# Patient Record
Sex: Male | Born: 1940 | Race: White | Hispanic: No | Marital: Married | Smoking: Former smoker
Health system: Southern US, Community
[De-identification: ages and names within clinical notes are randomized; demographics above are authoritative.]

## PROBLEM LIST (undated history)

## (undated) DIAGNOSIS — I071 Rheumatic tricuspid insufficiency: Secondary | ICD-10-CM

## (undated) DIAGNOSIS — I340 Nonrheumatic mitral (valve) insufficiency: Secondary | ICD-10-CM

## (undated) DIAGNOSIS — D649 Anemia, unspecified: Secondary | ICD-10-CM

## (undated) DIAGNOSIS — I2510 Atherosclerotic heart disease of native coronary artery without angina pectoris: Secondary | ICD-10-CM

## (undated) DIAGNOSIS — I119 Hypertensive heart disease without heart failure: Secondary | ICD-10-CM

## (undated) DIAGNOSIS — E785 Hyperlipidemia, unspecified: Secondary | ICD-10-CM

## (undated) DIAGNOSIS — R42 Dizziness and giddiness: Secondary | ICD-10-CM

## (undated) DIAGNOSIS — I499 Cardiac arrhythmia, unspecified: Secondary | ICD-10-CM

## (undated) DIAGNOSIS — I10 Essential (primary) hypertension: Secondary | ICD-10-CM

## (undated) DIAGNOSIS — Z974 Presence of external hearing-aid: Secondary | ICD-10-CM

## (undated) DIAGNOSIS — I272 Pulmonary hypertension, unspecified: Secondary | ICD-10-CM

## (undated) DIAGNOSIS — E119 Type 2 diabetes mellitus without complications: Secondary | ICD-10-CM

## (undated) DIAGNOSIS — Z972 Presence of dental prosthetic device (complete) (partial): Secondary | ICD-10-CM

## (undated) DIAGNOSIS — R519 Headache, unspecified: Secondary | ICD-10-CM

## (undated) DIAGNOSIS — R51 Headache: Secondary | ICD-10-CM

## (undated) HISTORY — PX: CHOLECYSTECTOMY: SHX55

## (undated) HISTORY — PX: CARDIAC CATHETERIZATION: SHX172

## (undated) HISTORY — PX: EYE SURGERY: SHX253

---

## 2004-07-25 ENCOUNTER — Ambulatory Visit: Payer: Self-pay | Admitting: Otolaryngology

## 2004-08-01 ENCOUNTER — Ambulatory Visit: Payer: Self-pay | Admitting: Otolaryngology

## 2004-10-07 ENCOUNTER — Ambulatory Visit: Payer: Self-pay | Admitting: Family Medicine

## 2006-03-25 ENCOUNTER — Ambulatory Visit: Payer: Self-pay | Admitting: Unknown Physician Specialty

## 2006-12-09 IMAGING — CT CT HEAD WITHOUT CONTRAST
1 series · 16 of 30 positions shown, 20 images · non-contrast
Comparison: none

REASON FOR EXAM: LEFT side hearing loss
COMMENTS:

[Series 2: without · axial · non-contrast · 0.41mm/px · z∈[+326,+481]mm · 16 of 35 slices shown, 20 images]
[im 2/35  brain]
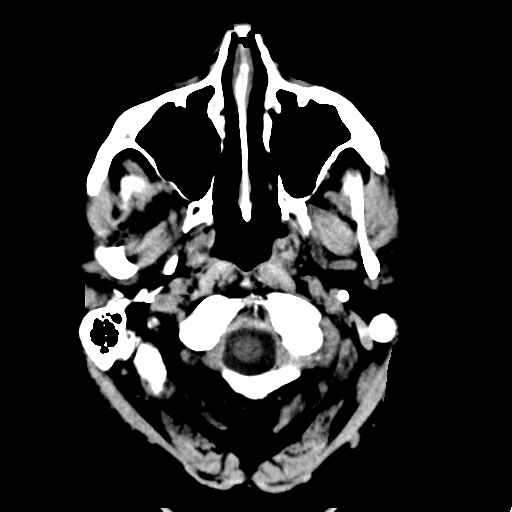
[im 2/35  bone]
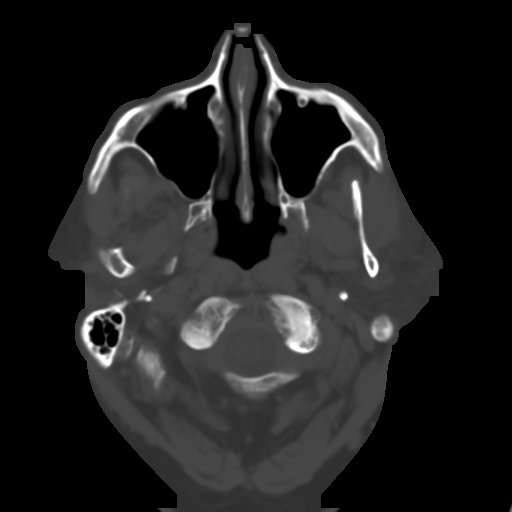
[im 4/35  brain]
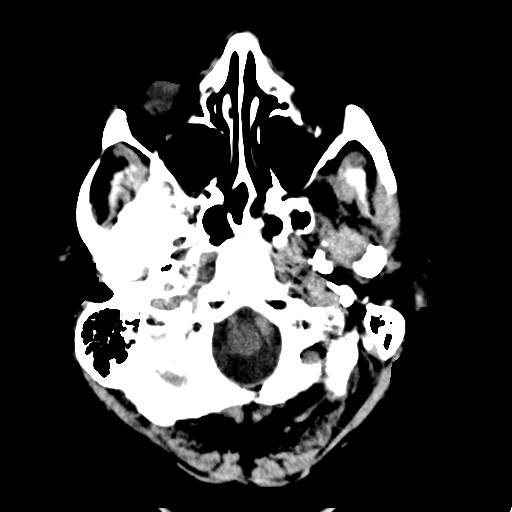
[im 6/35  brain]
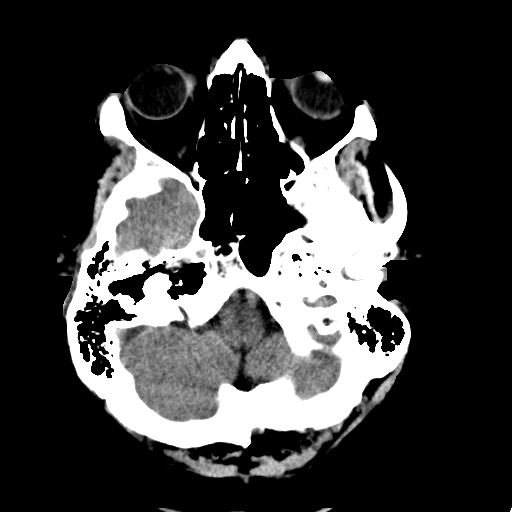
[im 9/35  brain]
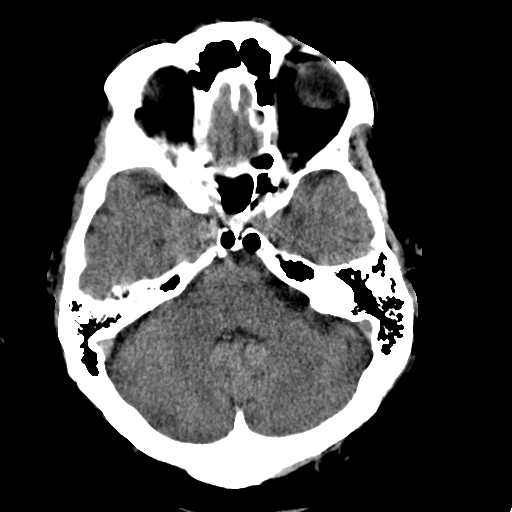
[im 10/35  brain]
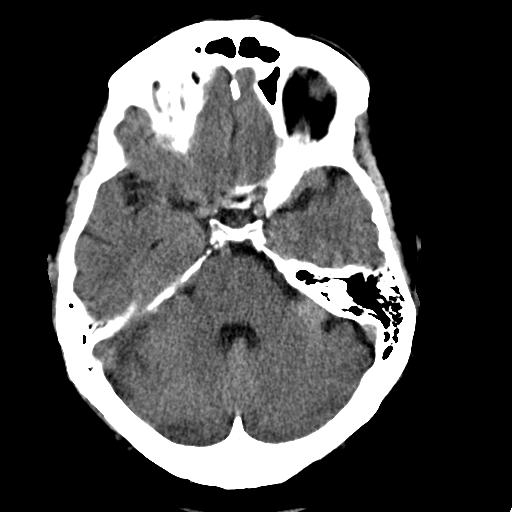
[im 10/35  bone]
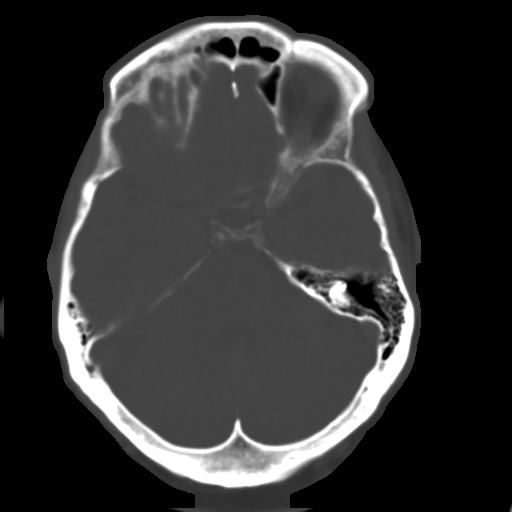
[im 12/35  brain]
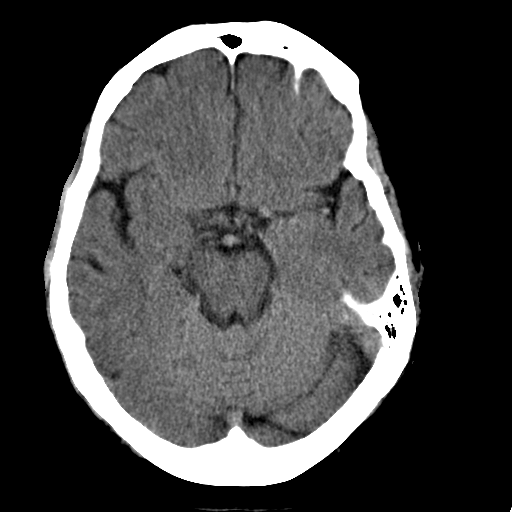
[im 15/35  brain]
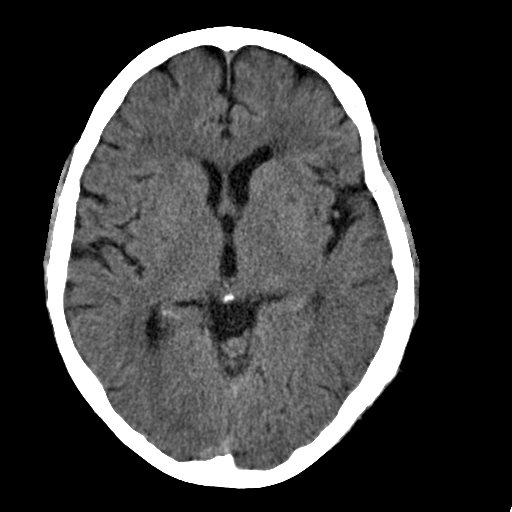
[im 17/35  brain]
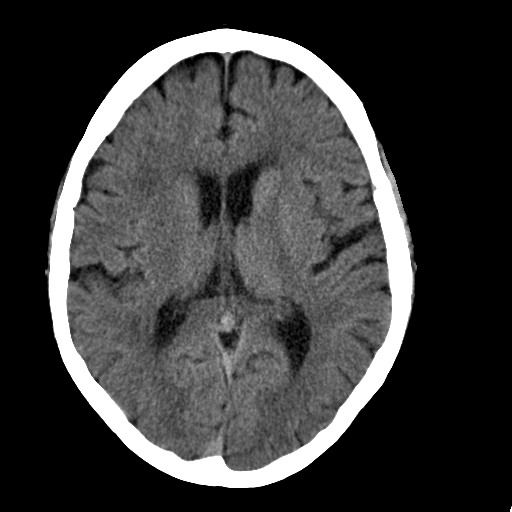
[im 18/35  brain]
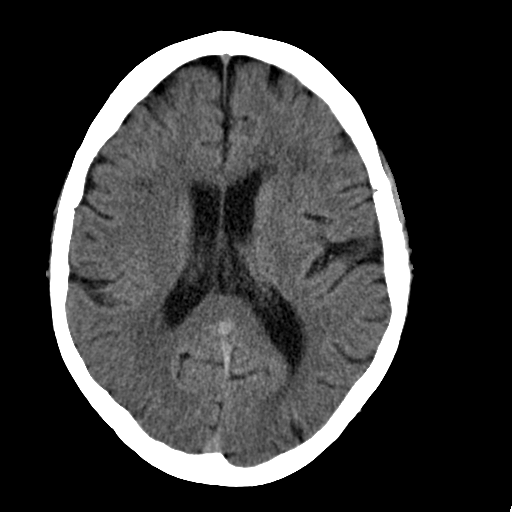
[im 18/35  bone]
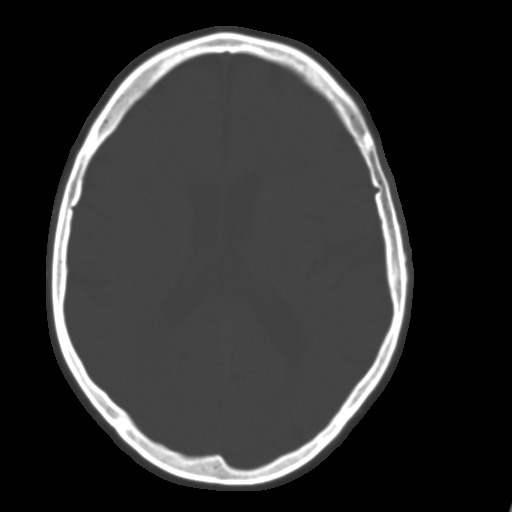
[im 20/35  brain]
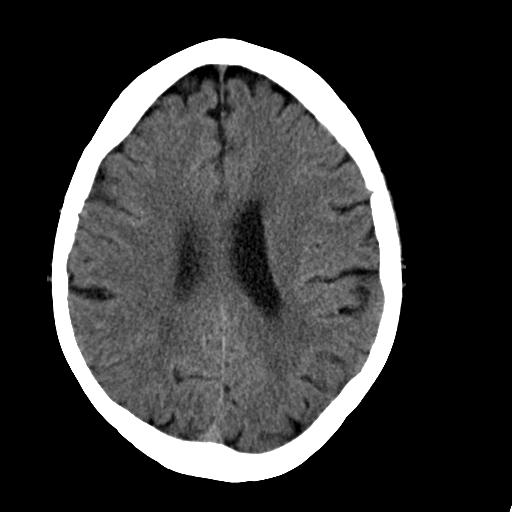
[im 23/35  brain]
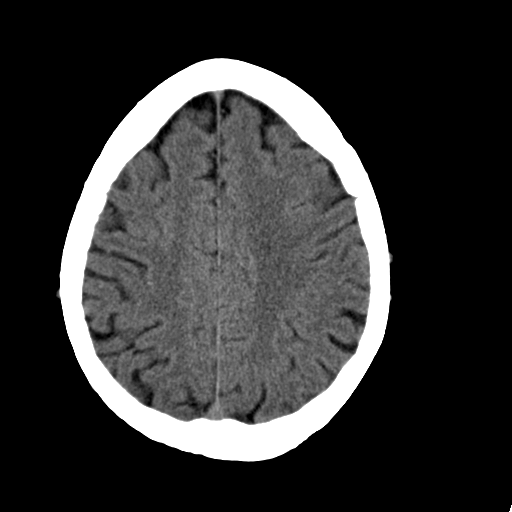
[im 25/35  brain]
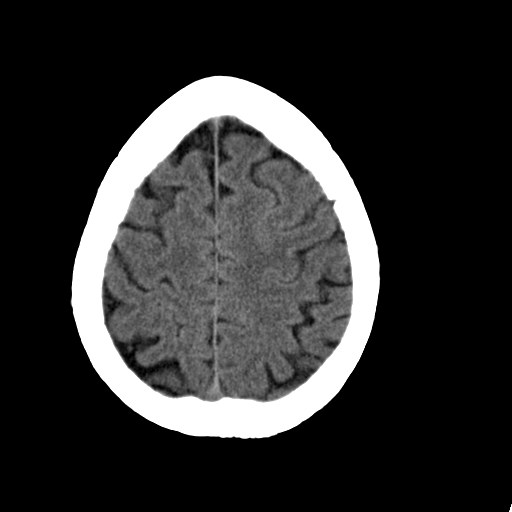
[im 26/35  brain]
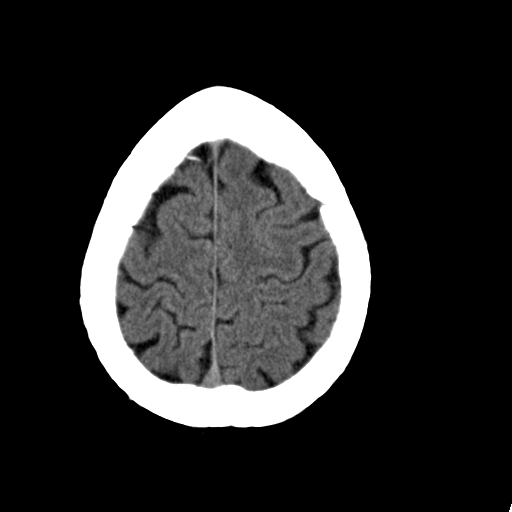
[im 26/35  bone]
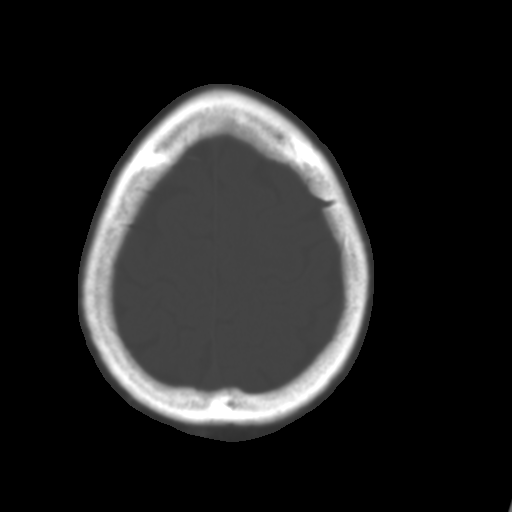
[im 29/35  brain]
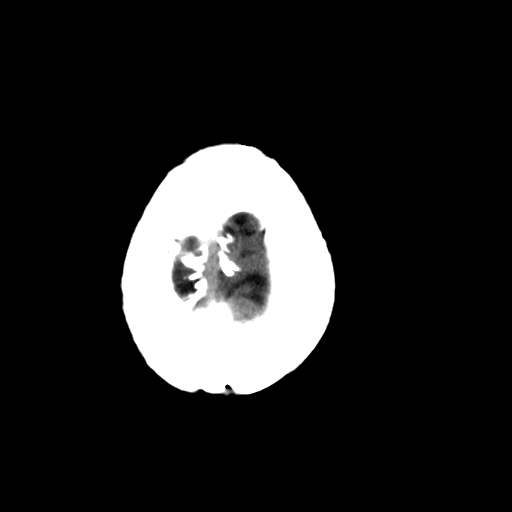
[im 31/35  brain]
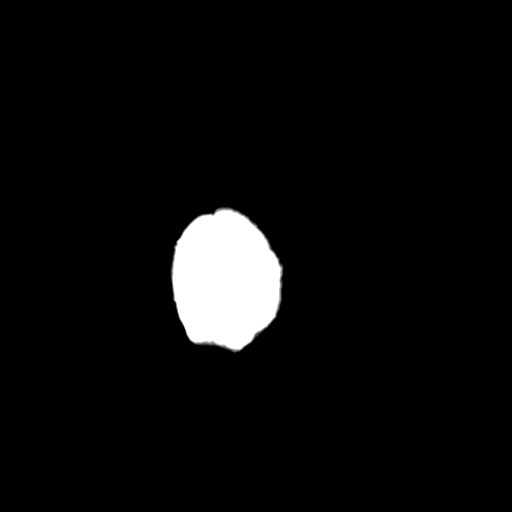
[im 33/35  brain]
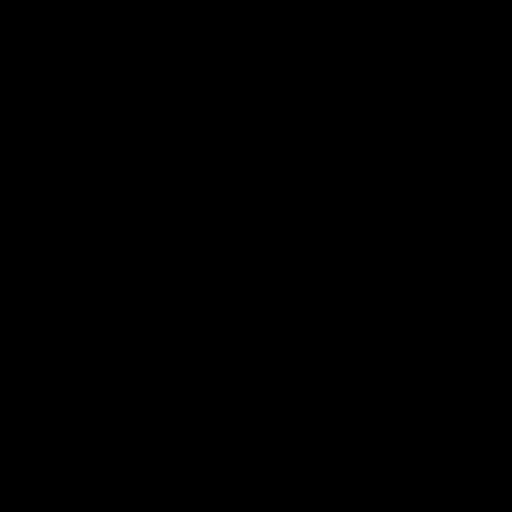

[16 of 30 positions shown; findings below may reference images not displayed]

PROCEDURE:     CT  - CT HEAD WITHOUT CONTRAST  - August 01, 2004  [DATE]

RESULT:     There is no evidence of intraaxial or extraaxial fluid
collections or evidence of acute hemorrhage.

Age-related changes are evident demonstrated by small vessel periventricular
white matter ischemia and mild cortical atrophy.
IMPRESSION: Age-related and chronic changes as described above.

Note not mentioned above there is also evidence of mild basal ganglia
calcifications.

No evidence of acute abnormalities.

## 2007-09-28 ENCOUNTER — Ambulatory Visit: Payer: Self-pay | Admitting: Ophthalmology

## 2008-04-24 ENCOUNTER — Ambulatory Visit: Payer: Self-pay | Admitting: Internal Medicine

## 2008-06-06 ENCOUNTER — Ambulatory Visit: Payer: Self-pay | Admitting: Unknown Physician Specialty

## 2010-09-01 IMAGING — US US EXTREM LOW VENOUS*R*
1 series · 17 of 24 positions shown · non-contrast
Comparison: none

eval DVT
COMMENTS:

PROCEDURE:     US  - US DOPPLER LOW EXTR RIGHT  - April 24, 2008  [DATE]
RESULT:     The augmentation flow waveforms are normal in appearance. The
femoral and popliteal vein shows complete compressibility throughout its
course. Doppler examination shows no occlusion or evidence of deep vein
thrombosis. In the soft tissues of the lower leg near the region of trauma
there are noted two superficial fluid collections with the larger measuring
2.97 cm x 0.98 cm x 2.15 cm and the smaller which is located more superiorly
measuring 2.16 cm x 1.13 cm x 2.72 cm. These sites likely represent areas of
hematoma although serous or lymphatic collections could produce a similar
appearance.

[Series 1: us extrem low venous*right* · 17 of 34 slices shown]
[im 1/34]
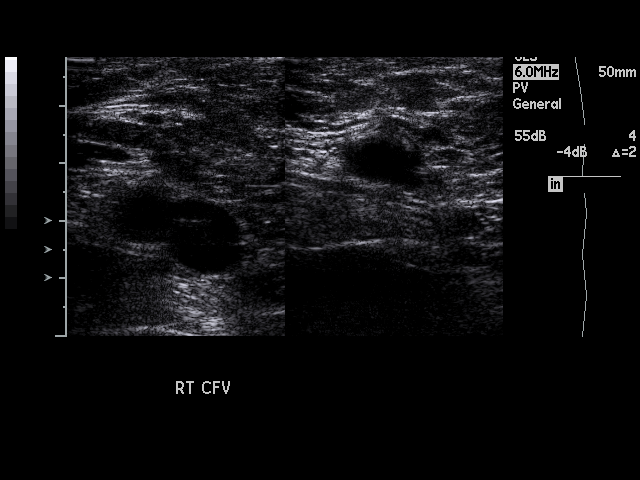
[im 3/34]
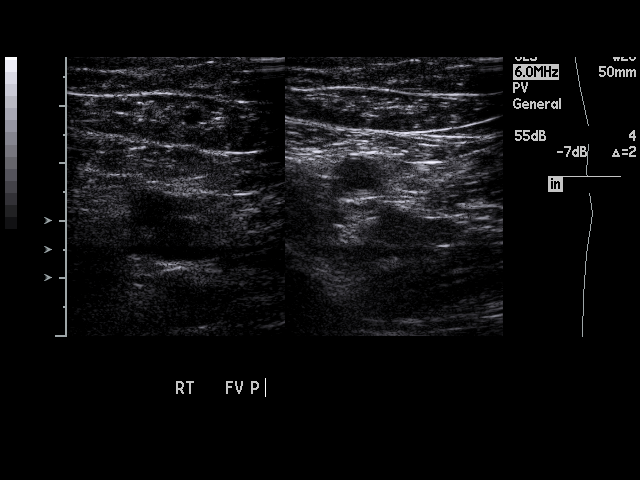
[im 5/34]
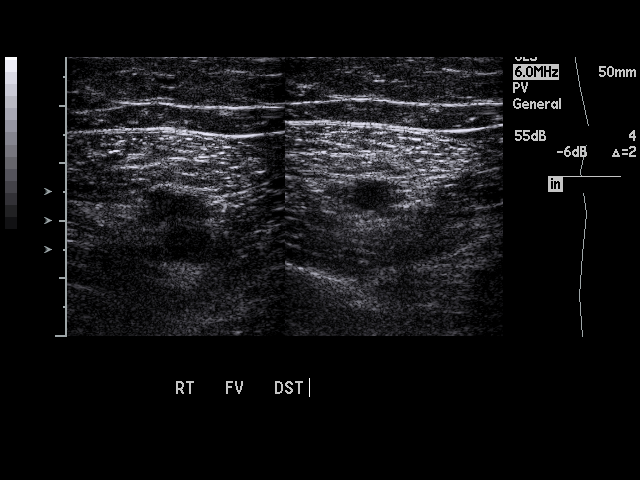
[im 6/34]
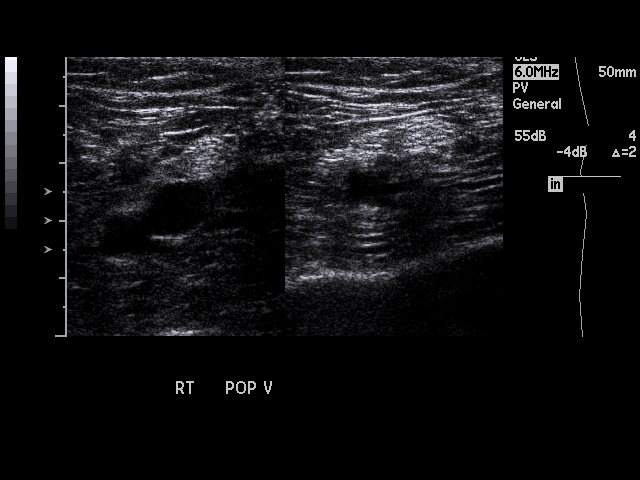
[im 9/34]
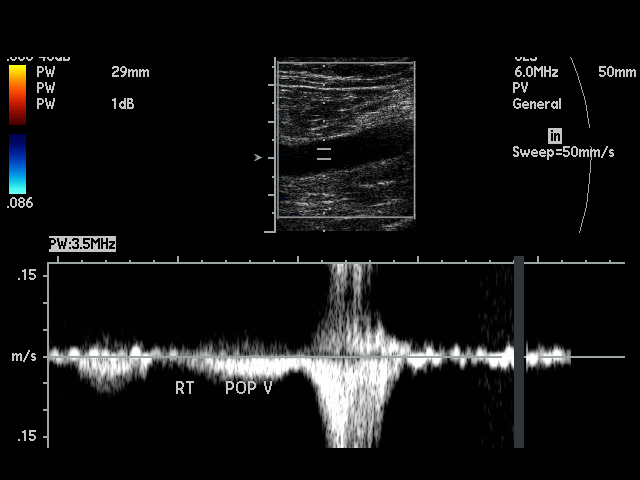
[im 11/34]
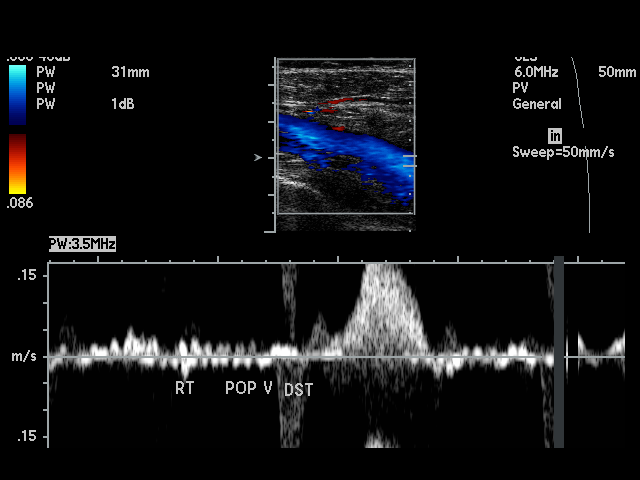
[im 13/34]
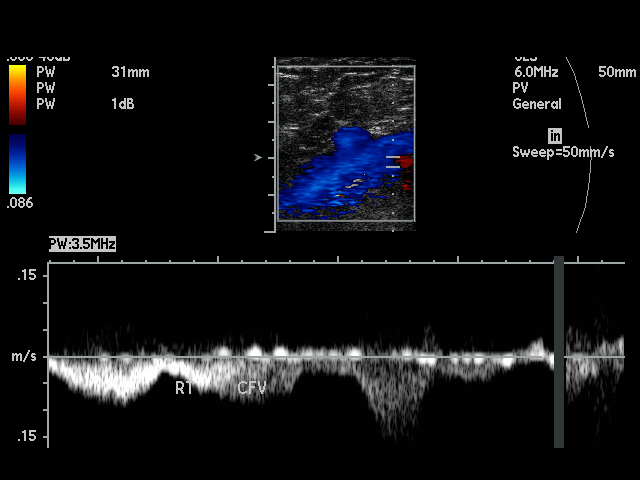
[im 15/34]
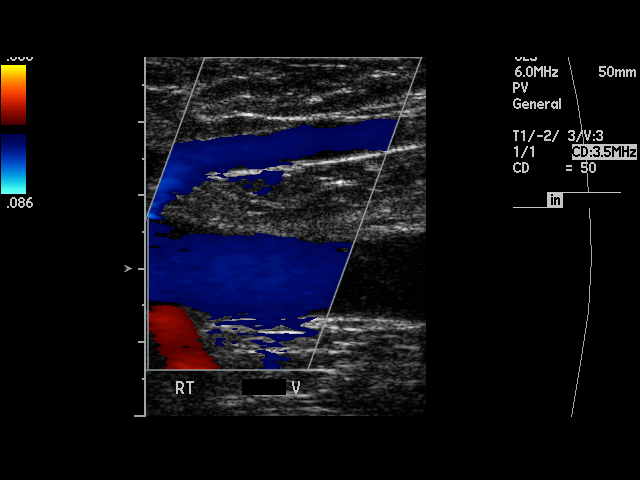
[im 18/34]
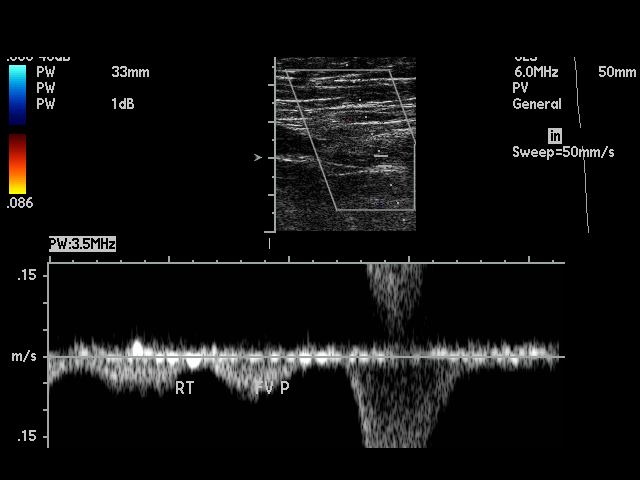
[im 19/34]
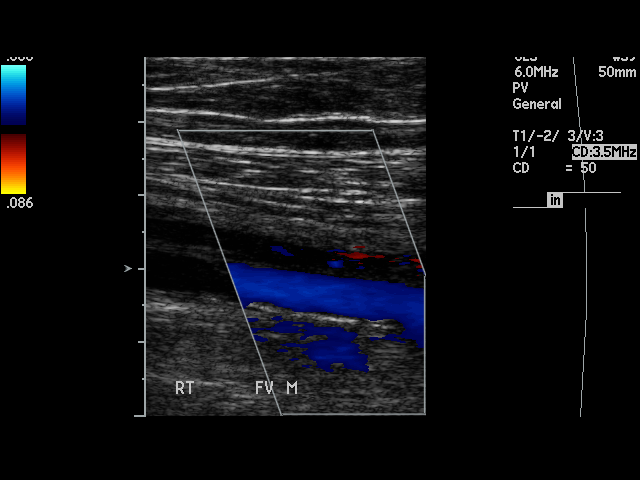
[im 21/34]
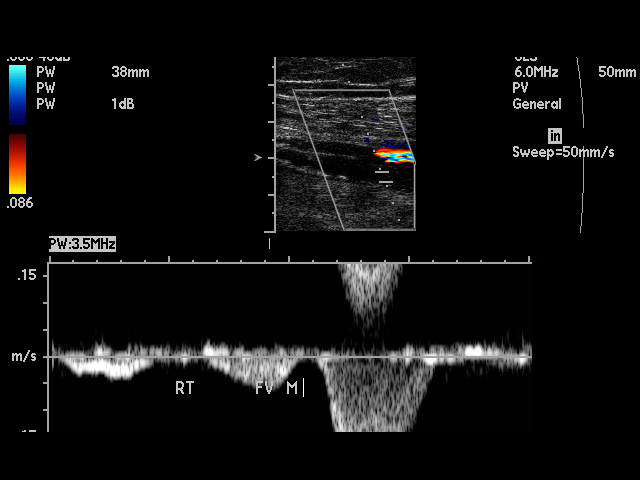
[im 23/34]
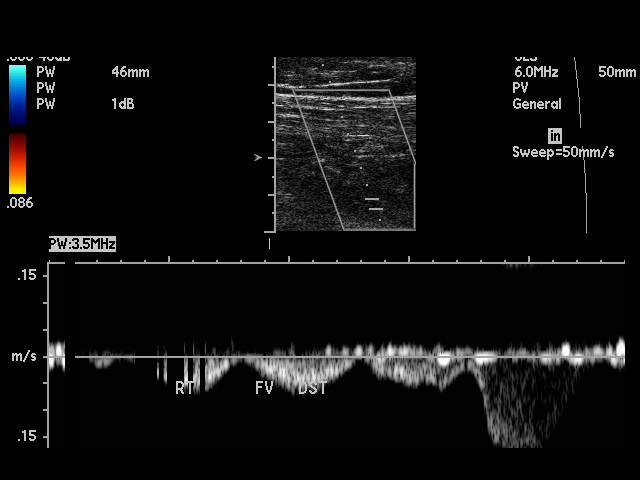
[im 25/34]
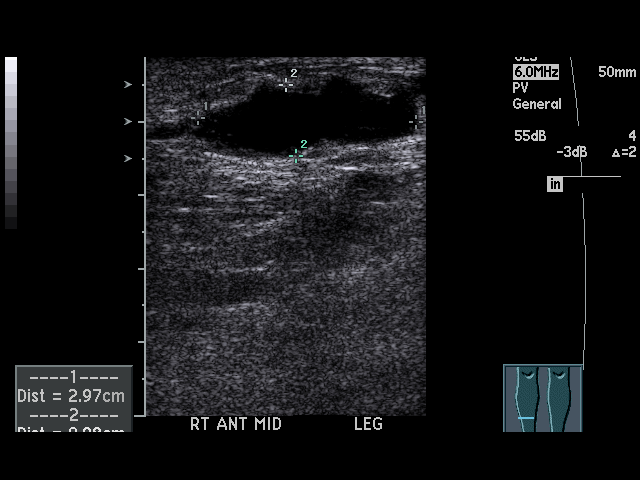
[im 28/34]
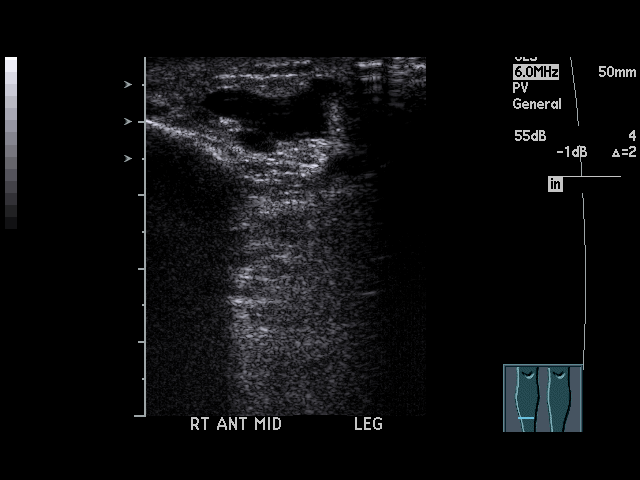
[im 29/34]
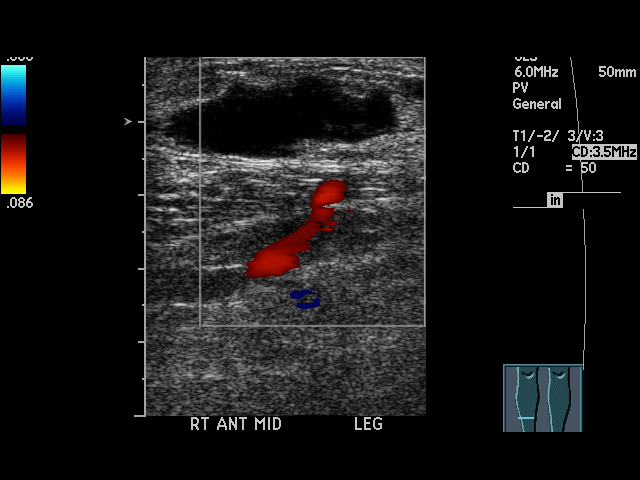
[im 31/34]
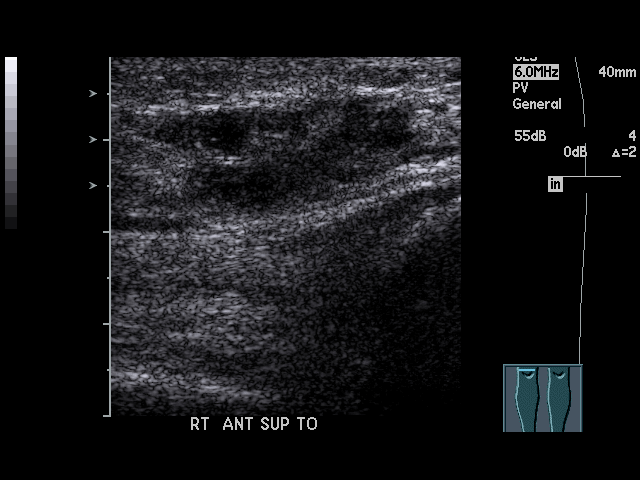
[im 34/34]
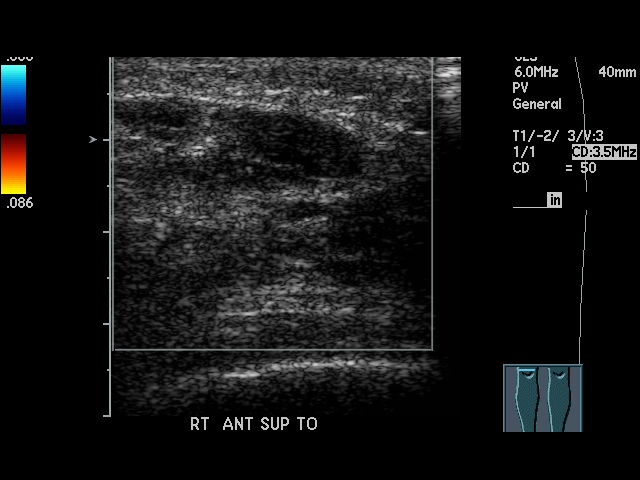

[17 of 24 positions shown; findings below may reference images not displayed]

IMPRESSION: 1. No deep vein thrombosis is identified.
2. Fluid collections are noted in the lower leg and likely represent
hematomas in view of patient's history of recent trauma.

## 2011-12-30 ENCOUNTER — Encounter: Payer: Self-pay | Admitting: Unknown Physician Specialty

## 2012-01-21 ENCOUNTER — Encounter: Payer: Self-pay | Admitting: Unknown Physician Specialty

## 2014-11-10 ENCOUNTER — Encounter: Payer: Self-pay | Admitting: *Deleted

## 2014-11-13 ENCOUNTER — Ambulatory Visit: Payer: Medicare Other | Admitting: Anesthesiology

## 2014-11-13 ENCOUNTER — Encounter
Admission: RE | Disposition: A | Discharge status: Home / Self Care | Payer: Self-pay | Source: Ambulatory Visit | Attending: Unknown Physician Specialty

## 2014-11-13 ENCOUNTER — Ambulatory Visit
Admission: RE | Admit: 2014-11-13 | Discharge: 2014-11-13 | Disposition: A | Discharge status: Home / Self Care | Payer: Medicare Other | Source: Ambulatory Visit | Attending: Unknown Physician Specialty | Admitting: Unknown Physician Specialty

## 2014-11-13 ENCOUNTER — Encounter: Payer: Self-pay | Admitting: *Deleted

## 2014-11-13 DIAGNOSIS — I4891 Unspecified atrial fibrillation: Secondary | ICD-10-CM | POA: Insufficient documentation

## 2014-11-13 DIAGNOSIS — I340 Nonrheumatic mitral (valve) insufficiency: Secondary | ICD-10-CM | POA: Insufficient documentation

## 2014-11-13 DIAGNOSIS — Z86010 Personal history of colonic polyps: Secondary | ICD-10-CM | POA: Diagnosis not present

## 2014-11-13 DIAGNOSIS — K5730 Diverticulosis of large intestine without perforation or abscess without bleeding: Secondary | ICD-10-CM | POA: Diagnosis not present

## 2014-11-13 DIAGNOSIS — K640 First degree hemorrhoids: Secondary | ICD-10-CM | POA: Diagnosis not present

## 2014-11-13 DIAGNOSIS — I119 Hypertensive heart disease without heart failure: Secondary | ICD-10-CM | POA: Insufficient documentation

## 2014-11-13 DIAGNOSIS — Z79899 Other long term (current) drug therapy: Secondary | ICD-10-CM | POA: Insufficient documentation

## 2014-11-13 DIAGNOSIS — Z87891 Personal history of nicotine dependence: Secondary | ICD-10-CM | POA: Diagnosis not present

## 2014-11-13 DIAGNOSIS — E119 Type 2 diabetes mellitus without complications: Secondary | ICD-10-CM | POA: Insufficient documentation

## 2014-11-13 DIAGNOSIS — I2510 Atherosclerotic heart disease of native coronary artery without angina pectoris: Secondary | ICD-10-CM | POA: Insufficient documentation

## 2014-11-13 DIAGNOSIS — Z7982 Long term (current) use of aspirin: Secondary | ICD-10-CM | POA: Diagnosis not present

## 2014-11-13 DIAGNOSIS — Z1211 Encounter for screening for malignant neoplasm of colon: Secondary | ICD-10-CM | POA: Insufficient documentation

## 2014-11-13 DIAGNOSIS — I361 Nonrheumatic tricuspid (valve) insufficiency: Secondary | ICD-10-CM | POA: Insufficient documentation

## 2014-11-13 DIAGNOSIS — I272 Other secondary pulmonary hypertension: Secondary | ICD-10-CM | POA: Insufficient documentation

## 2014-11-13 DIAGNOSIS — J449 Chronic obstructive pulmonary disease, unspecified: Secondary | ICD-10-CM | POA: Diagnosis not present

## 2014-11-13 DIAGNOSIS — E785 Hyperlipidemia, unspecified: Secondary | ICD-10-CM | POA: Insufficient documentation

## 2014-11-13 HISTORY — DX: Hypertensive heart disease without heart failure: I11.9

## 2014-11-13 HISTORY — DX: Headache: R51

## 2014-11-13 HISTORY — DX: Rheumatic tricuspid insufficiency: I07.1

## 2014-11-13 HISTORY — DX: Pulmonary hypertension, unspecified: I27.20

## 2014-11-13 HISTORY — DX: Essential (primary) hypertension: I10

## 2014-11-13 HISTORY — PX: COLONOSCOPY WITH PROPOFOL: SHX5780

## 2014-11-13 HISTORY — DX: Headache, unspecified: R51.9

## 2014-11-13 HISTORY — DX: Cardiac arrhythmia, unspecified: I49.9

## 2014-11-13 HISTORY — DX: Nonrheumatic mitral (valve) insufficiency: I34.0

## 2014-11-13 HISTORY — DX: Atherosclerotic heart disease of native coronary artery without angina pectoris: I25.10

## 2014-11-13 HISTORY — DX: Hyperlipidemia, unspecified: E78.5

## 2014-11-13 HISTORY — DX: Type 2 diabetes mellitus without complications: E11.9

## 2014-11-13 LAB — GLUCOSE, CAPILLARY: GLUCOSE-CAPILLARY: 100 mg/dL — AB (ref 65–99)

## 2014-11-13 SURGERY — COLONOSCOPY WITH PROPOFOL
Anesthesia: General

## 2014-11-13 MED ORDER — PROPOFOL 500 MG/50ML IV EMUL
INTRAVENOUS | Status: DC | PRN
  Administered 2014-11-13: 100 ug/kg/min via INTRAVENOUS

## 2014-11-13 MED ORDER — SODIUM CHLORIDE 0.9 % IV SOLN: INTRAVENOUS | Status: DC

## 2014-11-13 MED ORDER — LACTATED RINGERS IV SOLN
INTRAVENOUS | Status: DC | PRN
  Administered 2014-11-13: 09:00:00 via INTRAVENOUS

## 2014-11-13 MED ORDER — SODIUM CHLORIDE 0.9 % IV SOLN
INTRAVENOUS | Status: DC
  Administered 2014-11-13: 09:00:00 via INTRAVENOUS

## 2014-11-13 NOTE — Op Note (Signed)
[facility] Gastroenterology Patient Name: Paul Santos Procedure Date: 11/13/2014 8:44 AM MRN: [id number] Account #: [id number] Date of Birth: 11-15-1940 Admit Type: Outpatient Age: 74 Room: [facility] ENDO ROOM 1 Gender: Male Note Status: Finalized Procedure:         Colonoscopy Indications:       High risk colon cancer surveillance: Personal history of                     colonic polyps Providers:         Manya Silvas, MD Referring MD:      Irven Easterly. Kary Kos, MD (Referring MD) Medicines:         Propofol per Anesthesia Complications:     No immediate complications. Procedure:         Pre-Anesthesia Assessment:                    - After reviewing the risks and benefits, the patient was                     deemed in satisfactory condition to undergo the procedure.                    After obtaining informed consent, the colonoscope was                     passed under direct vision. Throughout the procedure, the                     patient's blood pressure, pulse, and oxygen saturations                     were monitored continuously. The Colonoscope was                     introduced through the anus and advanced to the the cecum,                     identified by appendiceal orifice and ileocecal valve. The                     colonoscopy was performed without difficulty. The patient                     tolerated the procedure well. The quality of the bowel                     preparation was good. Findings:      Many small-mouthed diverticula were found in the sigmoid colon and in       the descending colon.      Internal hemorrhoids were found during endoscopy. The hemorrhoids were       small, medium-sized and Grade I (internal hemorrhoids that do not       prolapse).      The exam was otherwise without abnormality. Impression:        - Diverticulosis in the sigmoid colon and in the                     descending colon.                    - Internal  hemorrhoids.                    - The  examination was otherwise normal.                    - No specimens collected. Recommendation:    - The findings and recommendations were discussed with the                     patient's family. Manya Silvas, MD 11/13/2014 9:02:56 AM This report has been signed electronically. Number of Addenda: 0 Note Initiated On: 11/13/2014 8:44 AM Scope Withdrawal Time: 0 hours 9 minutes 29 seconds  Total Procedure Duration: 0 hours 13 minutes 2 seconds       [facility]

## 2014-11-13 NOTE — Anesthesia Preprocedure Evaluation (Addendum)
Anesthesia Evaluation  Patient identified by MRN, date of birth, ID band Patient awake    Reviewed: Allergy & Precautions, H&P , NPO status , Patient's Chart, lab work & pertinent test results, reviewed documented beta blocker date and time   History of Anesthesia Complications Negative for: history of anesthetic complications  Airway Mallampati: III  TM Distance: >3 FB Neck ROM: full    Dental no notable dental hx. (+) Partial Upper, Partial Lower, Poor Dentition   Pulmonary neg shortness of breath, neg sleep apnea, neg COPD, neg recent URI, former smoker,    Pulmonary exam normal breath sounds clear to auscultation       Cardiovascular Exercise Tolerance: Good hypertension, (-) angina+ CAD and + Cardiac Stents (placed 10-12 years ago)  (-) Past MI and (-) CABG Normal cardiovascular exam+ dysrhythmias Atrial Fibrillation + Valvular Problems/Murmurs  Rhythm:irregular Rate:Abnormal + Systolic murmurs    Neuro/Psych negative neurological ROS  negative psych ROS   GI/Hepatic negative GI ROS, Neg liver ROS,   Endo/Other  diabetes, Well Controlled  Renal/GU negative Renal ROS  negative genitourinary   Musculoskeletal   Abdominal   Peds  Hematology negative hematology ROS (+)   Anesthesia Other Findings Past Medical History:   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*              Chronic pulmonary hypertension (HCC)                         Moderate tricuspid insufficiency                             Moderate mitral insufficiency                                Hypertension                                                 Diabetes mellitus without complication (HCC)                 Coronary artery disease                                      Dysrhythmia                                                    Comment:Atrial Fibrillation   Hyperlipidemia                                               Headache                                                        Comment:Migraines   Reproductive/Obstetrics negative OB  ROS                            Anesthesia Physical Anesthesia Plan  ASA: III  Anesthesia Plan: General   Post-op Pain Management:    Induction:   Airway Management Planned:   Additional Equipment:   Intra-op Plan:   Post-operative Plan:   Informed Consent: I have reviewed the patients History and Physical, chart, labs and discussed the procedure including the risks, benefits and alternatives for the proposed anesthesia with the patient or authorized representative who has indicated his/her understanding and acceptance.   Dental Advisory Given  Plan Discussed with: Anesthesiologist, CRNA and Surgeon  Anesthesia Plan Comments:         Anesthesia Quick Evaluation

## 2014-11-13 NOTE — Anesthesia Postprocedure Evaluation (Signed)
  Anesthesia Post-op Note  Patient: Paul Santos  Procedure(s) Performed: Procedure(s): COLONOSCOPY WITH PROPOFOL (N/A)  Anesthesia type:General  Patient location: PACU  Post pain: Pain level controlled  Post assessment: Post-op Vital signs reviewed, Patient's Cardiovascular Status Stable, Respiratory Function Stable, Patent Airway and No signs of Nausea or vomiting  Post vital signs: Reviewed and stable  Last Vitals:  Filed Vitals:   11/13/14 0930  BP: 182/91  Pulse: 146  Temp:   Resp: 19    Level of consciousness: awake, alert  and patient cooperative  Complications: No apparent anesthesia complications

## 2014-11-13 NOTE — H&P (Signed)
Primary Care Physician:  Maryland Pink, MD Primary Gastroenterologist:  Dr. Vira Agar  Pre-Procedure History & Physical: HPI:  Paul Santos is a 74 y.o. male is here for an colonoscopy.   Past Medical History  Diagnosis Date  . 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rtensive disease   . Chronic pulmonary hypertension ([place])   . Moderate tricuspid insufficiency   . Moderate mitral insufficiency   . Hypertension   . Diabetes mellitus without complication ([place])   . Coronary artery disease   . Dysrhythmia     Atrial Fibrillation  . Hyperlipidemia   . Headache     Migraines    Past Surgical History  Procedure Laterality Date  . Cholecystectomy    . Eye surgery    . Cardiac catheterization      with Stent (2005)    Prior to Admission medications   Medication Sig Start Date End Date Taking? Authorizing Provider  amLODipine (NORVASC) 10 MG tablet Take 10 mg by mouth daily.   Yes Historical Provider, MD  aspirin 325 MG EC tablet Take 325 mg by mouth daily.   Yes Historical Provider, MD  atorvastatin (LIPITOR) 20 MG tablet Take 20 mg by mouth daily.   Yes Historical Provider, MD  benazepril (LOTENSIN) 20 MG tablet Take 20 mg by mouth daily.   Yes Historical Provider, MD  cyanocobalamin 1000 MCG tablet Take 2,000 mcg by mouth daily.   Yes Historical Provider, MD  ferrous sulfate 325 (65 FE) MG tablet Take 325 mg by mouth daily with breakfast.   Yes Historical Provider, MD  fluticasone (FLONASE) 50 MCG/ACT nasal spray Place 2 sprays into both nostrils daily.   Yes Historical Provider, MD  meloxicam (MOBIC) 15 MG tablet Take 15 mg by mouth daily.   Yes Historical Provider, MD  metFORMIN (GLUCOPHAGE) 500 MG tablet Take by mouth 2 (two) times daily with a meal.   Yes Historical Provider, MD  methocarbamol (ROBAXIN) 750 MG tablet Take 750 mg by mouth 2 (two) times daily as needed for muscle spasms.   Yes Historical Provider, MD  telmisartan-hydrochlorothiazide (MICARDIS HCT)  80-25 MG tablet Take 1 tablet by mouth daily.   Yes Historical Provider, MD  timolol (TIMOPTIC) 0.5 % ophthalmic solution Place 1 drop into both eyes 2 (two) times daily.   Yes Historical Provider, MD    Allergies as of 10/19/2014  . (Not on File)    History reviewed. No pertinent family history.  Social History   Social History  . Marital Status: Married    Spouse Name: N/A  . Number of Children: N/A  . Years of Education: N/A   Occupational History  . Not on file.   Social History Main Topics  . Smoking status: Former Smoker    Quit date: 08/09/1985  . Smokeless tobacco: Never Used  . Alcohol Use: No     Comment: Former ETOH Abuse, stopped drinking 1987  . Drug Use: No  . Sexual Activity: Not on file   Other Topics Concern  . Not on file   Social History Narrative    Review of Systems: See HPI, otherwise negative ROS  Physical Exam: BP 155/89 mmHg  Pulse 74  Temp(Src) 97.9 F (36.6 C) (Oral)  Resp 12  Ht 5\' 8"  (1.727 m)  Wt 83.008 kg (183 lb)  BMI 27.83 kg/m2  SpO2 99% General:   Alert,  pleasant and cooperative in NAD Head:  Normocephalic and atraumatic. Neck:  Supple; no masses or thyromegaly. Lungs:  Clear throughout to auscultation.    Heart:  Regular rate and rhythm. Abdomen:  Soft, nontender and nondistended. Normal bowel sounds, without guarding, and without rebound.   Neurologic:  Alert and  oriented x4;  grossly normal neurologically.  Impression/Plan: Paul Santos is here for an colonoscopy to be performed for [facility] colon polyps  Risks, benefits, limitations, and alternatives regarding  colonoscopy have been reviewed with the patient.  Questions have been answered.  All parties agreeable.   Gaylyn Cheers, MD  11/13/2014, 8:35 AM

## 2014-11-13 NOTE — Transfer of Care (Signed)
Immediate Anesthesia Transfer of Care Note  Patient: Paul Santos  Procedure(s) Performed: Procedure(s): COLONOSCOPY WITH PROPOFOL (N/A)  Patient Location: PACU  Anesthesia Type:MAC  Level of Consciousness: awake and alert   Airway & Oxygen Therapy: Patient Spontanous Breathing and Patient connected to nasal cannula oxygen  Post-op Assessment: Report given to RN and Post -op Vital signs reviewed and stable  Post vital signs: Reviewed and stable  Last Vitals:  Filed Vitals:   11/13/14 0810  BP: 155/89  Pulse: 74  Temp: 36.6 C  Resp: 12    Complications: No apparent anesthesia complications

## 2014-11-16 ENCOUNTER — Encounter: Payer: Self-pay | Admitting: Unknown Physician Specialty

## 2015-01-04 ENCOUNTER — Encounter: Payer: Medicare Other | Attending: Surgery | Admitting: Surgery

## 2015-01-04 DIAGNOSIS — L2084 Intrinsic (allergic) eczema: Secondary | ICD-10-CM | POA: Insufficient documentation

## 2015-01-04 DIAGNOSIS — I10 Essential (primary) hypertension: Secondary | ICD-10-CM | POA: Diagnosis not present

## 2015-01-04 DIAGNOSIS — E11620 Type 2 diabetes mellitus with diabetic dermatitis: Secondary | ICD-10-CM | POA: Insufficient documentation

## 2015-01-04 DIAGNOSIS — E11622 Type 2 diabetes mellitus with other skin ulcer: Secondary | ICD-10-CM | POA: Insufficient documentation

## 2015-01-04 DIAGNOSIS — Z87891 Personal history of nicotine dependence: Secondary | ICD-10-CM | POA: Diagnosis not present

## 2015-01-04 DIAGNOSIS — I2510 Atherosclerotic heart disease of native coronary artery without angina pectoris: Secondary | ICD-10-CM | POA: Diagnosis not present

## 2015-01-05 NOTE — Progress Notes (Signed)
AVANISH, WIEMKEN ([id number]) Visit Report for 01/04/2015 Allergy List Details Patient Name: Paul Santos, Paul Santos. Date of Service: 01/04/2015 1:30 PM Medical Record Number: [id number] Patient Account Number: [id number] Date of Birth/Sex: [birth year] (74 y.o. Male) Treating RN: Montey Hora Primary Care Physician: Maryland Pink Other Clinician: Referring Physician: Maryland Pink Treating Physician/Extender: Frann Rider in Treatment: 0 Allergies Active Allergies No Known Allergies Allergy Notes Electronic Signature(s) Signed: 01/04/2015 5:39:20 PM By: Montey Hora Entered By: Montey Hora on 01/04/2015 14:00:22 Paul Santos ([id number]) -------------------------------------------------------------------------------- [place] Information Details Patient Name: Paul Santos Date of Service: 01/04/2015 1:30 PM Medical Record Number: [id number] Patient Account Number: [id number] Date of Birth/Sex: 1940-06-17 (74 y.o. Male) Treating RN: Montey Hora Primary Care Physician: Maryland Pink Other Clinician: Referring Physician: Maryland Pink Treating Physician/Extender: Frann Rider in Treatment: 0 Visit Information Patient Arrived: Ambulatory Arrival Time: 13:56 Accompanied By: spouse Transfer Assistance: None Patient Identification Verified: Yes Secondary Verification Process Yes Completed: Patient Has Alerts: Yes Patient Alerts: DMII aspirin 325 Electronic Signature(s) Signed: 01/04/2015 5:39:20 PM By: Montey Hora Entered By: Montey Hora on 01/04/2015 13:56:53 [place], [name] ([id number]) -------------------------------------------------------------------------------- Encounter Discharge Information Details Patient Name: Paul Santos Date of Service: 01/04/2015 1:30 PM Medical Record Number: [id number] Patient Account Number: [id number] Date of Birth/Sex: 07-15-1940 (74 y.o. Male) Treating RN: Montey Hora Primary Care  Physician: Maryland Pink Other Clinician: Referring Physician: Maryland Pink Treating Physician/Extender: Frann Rider in Treatment: 0 Encounter Discharge Information Items Discharge Pain Level: 0 Discharge Condition: Stable Ambulatory Status: Ambulatory Discharge Destination: Home Transportation: Private Auto Accompanied By: spouse Schedule Follow-up Appointment: Yes Medication Reconciliation completed and provided to Patient/Care No Pharoah Goggins: Provided on Clinical Summary of Care: 01/04/2015 Form Type Recipient Paper Patient MW Electronic Signature(s) Signed: 01/04/2015 4:28:11 PM By: Montey Hora Previous Signature: 01/04/2015 3:01:31 PM Version By: Ruthine Dose Entered By: Montey Hora on 01/04/2015 16:28:11 [place], [name] ([id number]) -------------------------------------------------------------------------------- Lower Extremity Assessment Details Patient Name: Paul Santos Date of Service: 01/04/2015 1:30 PM Medical Record Number: [id number] Patient Account Number: [id number] Date of Birth/Sex: 06-17-1940 (74 y.o. Male) Treating RN: Montey Hora Primary Care Physician: Maryland Pink Other Clinician: Referring Physician: Maryland Pink Treating Physician/Extender: Frann Rider in Treatment: 0 Edema Assessment Assessed: Shirlyn Goltz: No] Patrice Paradise: No] Edema: [Left: Yes] [Right: Yes] Calf Left: Right: Point of Measurement: 32 cm From Medial Instep 33.8 cm 35.6 cm Ankle Left: Right: Point of Measurement: 11 cm From Medial Instep 23.6 cm 23.4 cm Vascular Assessment Claudication: Claudication Assessment [Left:None] [Right:None] Pulses: Posterior Tibial Palpable: [Left:No] [Right:No] Doppler: [Left:Multiphasic] [Right:Monophasic] Dorsalis Pedis Palpable: [Left:Yes] [Right:Yes] Doppler: [Left:Multiphasic] [Right:Monophasic] Extremity colors, hair growth, and conditions: Extremity Color: [Left:Red] [Right:Red] Hair Growth on Extremity:  [Left:No] [Right:No] Temperature of Extremity: [Left:Warm] [Right:Warm] Capillary Refill: [Left:< 3 seconds] [Right:< 3 seconds] Blood Pressure: Brachial: [Left:128] [Right:132] Dorsalis Pedis: 148 [Left:Dorsalis Pedis: 136] Ankle: Posterior Tibial: 130 [Left:Posterior Tibial: 150 1.12] [Right:1.14] Toe Nail Assessment Left: Right: Thick: Yes Yes Discolored: Yes Yes Shafran, Kin W. ([id number]) Deformed: Yes Yes Improper Length and Hygiene: No No Electronic Signature(s) Signed: 01/04/2015 5:39:20 PM By: Montey Hora Entered By: Montey Hora on 01/04/2015 14:29:44 [place], [name] ([id number]) -------------------------------------------------------------------------------- Multi Wound Chart Details Patient Name: Paul Santos Date of Service: 01/04/2015 1:30 PM Medical Record Number: [id number] Patient Account Number: [id number] Date of Birth/Sex: December 23, 1940 (74 y.o. Male) Treating RN: Montey Hora Primary Care Physician: Maryland Pink Other Clinician: Referring Physician: Maryland Pink Treating Physician/Extender: Frann Rider in Treatment: 0 Vital Signs Height(in): 68 Pulse(bpm): 74  Weight(lbs): 180 Blood Pressure 134/63 (mmHg): Body Mass Index(BMI): 27 Temperature(F): 97.5 Respiratory Rate 18 (breaths/min): Photos: [1:No Photos] [N/A:N/A] Wound Location: [1:Right Lower Leg - Circumfernential] [N/A:N/A] Wounding Event: [1:Gradually Appeared] [N/A:N/A] Primary Etiology: [1:Venous Leg Ulcer] [N/A:N/A] Comorbid History: [1:Glaucoma, Hypertension, Type II Diabetes] [N/A:N/A] Date Acquired: [1:12/14/2014] [N/A:N/A] Weeks of Treatment: [1:0] [N/A:N/A] Wound Status: [1:Open] [N/A:N/A] Measurements L x W x D 23x33x0.1 [N/A:N/A] (cm) Area (cm) : [id number] [N/A:N/A] Volume (cm) : [1:59.612] [N/A:N/A] Classification: [1:Partial Thickness] [N/A:N/A] HBO Classification: [1:Grade 1] [N/A:N/A] Exudate Amount: [1:Large] [N/A:N/A] Exudate Type:  [1:Serous] [N/A:N/A] Exudate Color: [1:amber] [N/A:N/A] Wound Margin: [1:Flat and Intact] [N/A:N/A] Granulation Amount: [1:Medium (34-66%)] [N/A:N/A] Granulation Quality: [1:Red, Pink] [N/A:N/A] Necrotic Amount: [1:Medium (34-66%)] [N/A:N/A] Exposed Structures: [1:Fascia: No Fat: No Tendon: No Muscle: No Joint: No Bone: No] [N/A:N/A] Limited to Skin Breakdown Epithelialization: None N/A N/A Periwound Skin Texture: Edema: No N/A N/A Excoriation: No Induration: No Callus: No Crepitus: No Fluctuance: No Friable: No Rash: No Scarring: No Periwound Skin Maceration: No N/A N/A Moisture: Moist: No Dry/Scaly: No Periwound Skin Color: Atrophie Blanche: No N/A N/A Cyanosis: No Ecchymosis: No Erythema: No Hemosiderin Staining: No Mottled: No Pallor: No Rubor: No Tenderness on Yes N/A N/A Palpation: Wound Preparation: Ulcer Cleansing: N/A N/A Rinsed/Irrigated with Saline Topical Anesthetic Applied: None Treatment Notes Electronic Signature(s) Signed: 01/04/2015 5:39:20 PM By: Montey Hora Entered By: Montey Hora on 01/04/2015 14:45:12 [place], [name] ([id number]) -------------------------------------------------------------------------------- [place] Details Patient Name: Paul Santos Date of Service: 01/04/2015 1:30 PM Medical Record Number: [id number] Patient Account Number: [id number] Date of Birth/Sex: Dec 05, 1940 (74 y.o. Male) Treating RN: Montey Hora Primary Care Physician: Maryland Pink Other Clinician: Referring Physician: Maryland Pink Treating Physician/Extender: Frann Rider in Treatment: 0 Active Inactive Electronic Signature(s) Signed: 01/04/2015 5:39:20 PM By: Montey Hora Entered By: Montey Hora on 01/04/2015 14:48:32 [place], [name] ([id number]) -------------------------------------------------------------------------------- Patient/Caregiver Education Details Patient Name: Paul Santos Date  of Service: 01/04/2015 1:30 PM Medical Record Number: [id number] Patient Account Number: [id number] Date of Birth/Gender: 09-Dec-1940 (74 y.o. Male) Treating RN: Montey Hora Primary Care Physician: Maryland Pink Other Clinician: Referring Physician: Maryland Pink Treating Physician/Extender: Frann Rider in Treatment: 0 Education Assessment Education Provided To: Patient Education Topics Provided Wound/Skin Impairment: Handouts: Other: see dermatologist and PCP asap Methods: Demonstration, Explain/Verbal Responses: State content correctly Electronic Signature(s) Signed: 01/04/2015 4:29:19 PM By: Montey Hora Entered By: Montey Hora on 01/04/2015 16:29:19 [place], [name] ([id number]) -------------------------------------------------------------------------------- Wound Assessment Details Patient Name: Paul Santos Date of Service: 01/04/2015 1:30 PM Medical Record Number: [id number] Patient Account Number: [id number] Date of Birth/Sex: 31-Mar-1940 (74 y.o. Male) Treating RN: Montey Hora Primary Care Physician: Maryland Pink Other Clinician: Referring Physician: Maryland Pink Treating Physician/Extender: Frann Rider in Treatment: 0 Wound Status Wound Number: 1 Primary Venous Leg Ulcer Etiology: Wound Location: Right Lower Leg - Circumfernential Wound Status: Open Wounding Event: Gradually Appeared Comorbid Glaucoma, Hypertension, Type II History: Diabetes Date Acquired: 12/14/2014 Weeks Of Treatment: 0 Clustered Wound: No Photos Wound Measurements Length: (cm) 23 Width: (cm) 33 Depth: (cm) 0.1 Area: (cm) 596.117 Volume: (cm) 59.612 % Reduction in Area: 0% % Reduction in Volume: 0% Epithelialization: None Tunneling: No Undermining: No Wound Description Classification: Partial Thickness Diabetic Severity Earleen Newport): Grade 1 Sorg, Braven W. ([id number]) Foul Odor After Cleansing: No Wound Margin: Flat and Intact Exudate  Amount: Large Exudate Type: Serous Exudate Color: amber Wound Bed Granulation Amount: Medium (34-66%) Exposed Structure Granulation Quality: Red, Pink Fascia Exposed: No Necrotic Amount: Medium (34-66%) Fat  Layer Exposed: No Necrotic Quality: Adherent Slough Tendon Exposed: No Muscle Exposed: No Joint Exposed: No Bone Exposed: No Limited to Skin Breakdown Periwound Skin Texture Texture Color No Abnormalities Noted: No No Abnormalities Noted: No Callus: No Atrophie Blanche: No Crepitus: No Cyanosis: No Excoriation: No Ecchymosis: No Fluctuance: No Erythema: No Friable: No Hemosiderin Staining: No Induration: No Mottled: No Localized Edema: No Pallor: No Rash: No Rubor: No Scarring: No Temperature / Pain Moisture Tenderness on Palpation: Yes No Abnormalities Noted: No Dry / Scaly: No Maceration: No Moist: No Wound Preparation Ulcer Cleansing: Rinsed/Irrigated with Saline Topical Anesthetic Applied: None Electronic Signature(s) Signed: 01/04/2015 4:36:18 PM By: Montey Hora Entered By: Montey Hora on 01/04/2015 16:36:18 [place], [name] ([id number]) -------------------------------------------------------------------------------- [place] Details Patient Name: Paul Santos Date of Service: 01/04/2015 1:30 PM Medical Record Number: [id number] Patient Account Number: [id number] Date of Birth/Sex: 01/10/1941 (74 y.o. Male) Treating RN: Montey Hora Primary Care Physician: Maryland Pink Other Clinician: Referring Physician: Maryland Pink Treating Physician/Extender: Frann Rider in Treatment: 0 Vital Signs Time Taken: 13:57 Temperature (F): 97.5 Height (in): 68 Pulse (bpm): 74 Source: Stated Respiratory Rate (breaths/min): 18 Weight (lbs): 180 Blood Pressure (mmHg): 134/63 Source: Stated Reference Range: 80 - 120 mg / dl Body Mass Index (BMI): 27.4 Electronic Signature(s) Signed: 01/04/2015 5:39:20 PM By: Montey Hora Entered  By: Montey Hora on 01/04/2015 13:59:50

## 2015-01-05 NOTE — Progress Notes (Addendum)
JIBRIL, ERHARDT ([id number]) Visit Report for 01/04/2015 Chief Complaint Document Details Patient Name: Paul Santos, Paul Santos. Date of Service: 01/04/2015 1:30 PM Medical Record Number: [id number] Patient Account Number: [id number] Date of Birth/Sex: October 31, 1940 (74 y.o. Male) Treating RN: Montey Hora Primary Care Physician: [place] Pink Other Clinician: Referring Physician: [place] Pink Treating Physician/Extender: Frann Rider in Treatment: 0 Information Obtained from: Patient Chief Complaint Patient presents to the wound care center for a consult due non healing wound for a scaly eczematous eruption on his right lower extremity which she's had for about a month Electronic Signature(s) Signed: 01/04/2015 2:57:29 PM By: Christin Fudge MD, FACS Entered By: Christin Fudge on 01/04/2015 14:57:28 Paul Santos, Paul Santos ([id number]) -------------------------------------------------------------------------------- HPI Details Patient Name: Paul Santos Date of Service: 01/04/2015 1:30 PM Medical Record Number: [id number] Patient Account Number: [id number] Date of Birth/Sex: 04/13/1940 (74 y.o. Male) Treating RN: Montey Hora Primary Care Physician: [place] Pink Other Clinician: Referring Physician: [place] Pink Treating Physician/Extender: Frann Rider in Treatment: 0 History of Present Illness Location: right lower extremity is raw and weeping and this has been there for about a month. Quality: Patient reports No Pain. Severity: Patient states wound are getting worse. Duration: Patient has had the wound for < 4 weeks prior to presenting for treatment Context: The wound appeared gradually over time Modifying Factors: Other treatment(s) tried include: saw the nurse practitioner at his PCPs office who thought it was a cellulitis of the right lower extremity and put the patient on Augmentin and sent him to the wound clinic. Associated Signs and Symptoms: Patient  reports having:a lot of itching and burning in the region. HPI Description: 74 year old gentleman who saw the nurse practitioner at his PCPs office recently and was diagnosed with a right lower extremity cellulitis which arose from an abrasion which she's had for about 3 weeks. The patient was put on Augmentin and sent to see [place] for an opinion. His past medical history significant for diabetes mellitus type 2, coronary artery disease, hypertension, status post cholecystectomy, colonoscope he and status post stomach surgery. He is a reformed smoker and has quit smoking in 1987 and at the same time stopped drinking alcohol. An x-ray of the right tibia and fibula was recommended but not has not been done. Electronic Signature(s) Signed: 01/04/2015 2:59:42 PM By: Christin Fudge MD, FACS Previous Signature: 01/04/2015 2:07:22 PM Version By: Christin Fudge MD, FACS Entered By: Christin Fudge on 01/04/2015 14:59:42 Paul Santos ([id number]) -------------------------------------------------------------------------------- Physical Exam Details Patient Name: Paul Santos Date of Service: 01/04/2015 1:30 PM Medical Record Number: [id number] Patient Account Number: [id number] Date of Birth/Sex: 05-20-1940 (74 y.o. Male) Treating RN: Montey Hora Primary Care Physician: [place] Pink Other Clinician: Referring Physician: [place] Pink Treating Physician/Extender: Frann Rider in Treatment: 0 Constitutional . Pulse regular. Respirations normal and unlabored. Afebrile. . Eyes Nonicteric. Reactive to light. Ears, Nose, Mouth, and Throat Lips, teeth, and gums WNL.Marland Kitchen Moist mucosa without lesions . Neck supple and nontender. No palpable supraclavicular or cervical adenopathy. Normal sized without goiter. Respiratory WNL. No retractions.. Breath sounds WNL, No rubs, rales, rhonchi, or wheeze.. Cardiovascular Pedal Pulses WNL. ABI on the right is 1.14 and the left is 1.12. no evidence  of lower extremity edema. Gastrointestinal (GI) Abdomen without masses or tenderness.. No liver or spleen enlargement or tenderness.. Lymphatic No adneopathy. No adenopathy. No adenopathy. Musculoskeletal Adexa without tenderness or enlargement.. Digits and nails w/o clubbing, cyanosis, infection, petechiae, ischemia, or inflammatory conditions.. Integumentary (Hair, Skin) the area on his  right lower extremity has significant amount of scaly skin and then a epidermal layer which is pinkish which looks like recent eczema and minimal weeping.Marland Kitchen No crepitus or fluctuance. No peri-wound warmth or erythema. No masses.Marland Kitchen Psychiatric Judgement and insight Intact.. No evidence of depression, anxiety, or agitation.. Notes the area on his right lower extremity has significant amount of scaly skin and then a epidermal layer which is pinkish which looks like recent eczema and minimal weeping. This is not a fungal infection nor is it a open ulceration or cellulitis. Electronic Signature(s) Signed: 01/04/2015 3:14:09 PM By: Christin Fudge MD, FACS Entered By: Christin Fudge on 01/04/2015 15:14:08 Paul Santos ([id number]) -------------------------------------------------------------------------------- Physician Orders Details Patient Name: Paul Santos Date of Service: 01/04/2015 1:30 PM Medical Record Number: [id number] Patient Account Number: [id number] Date of Birth/Sex: Jan 02, 1941 (74 y.o. 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 Discharge From [facility]  Services Wound #1 Right,Circumferential Lower Leg o Discharge from [place] - consult only - need to follow up with dermatologist Electronic Signature(s) Signed: 01/04/2015 4:24:56 PM By: Christin Fudge MD, FACS Signed: 01/04/2015 5:39:20 PM By: Montey Hora Entered By: Montey Hora on 01/04/2015 14:59:47 Marek, Paul Santos ([id number]) -------------------------------------------------------------------------------- Problem List Details Patient Name: Paul Santos Date of Service: 01/04/2015 1:30 PM Medical Record Number: [id number] Patient Account Number: [id number] Date of Birth/Sex: [birth year] (74 y.o. 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 Male) Treating RN: Montey Hora Primary Care Physician: [place] Pink Other Clinician: Referring Physician: [place] Pink Treating Physician/Extender: Frann Rider in Treatment: 0 Subjective Chief Complaint Information obtained from Patient Patient presents to the wound care center for a consult due non healing wound for a  scaly eczematous eruption on his right lower extremity which she's had for about a month History of Present Illness (HPI) The following HPI elements were documented for the patient's wound: Location: right lower extremity is raw and weeping and this has been there for about a month. Quality: Patient reports No Pain. Severity: Patient states wound are getting worse. Duration: Patient has had the wound for < 4 weeks prior to presenting for treatment Context: The wound appeared gradually over time Modifying Factors: Other treatment(s) tried include: saw the nurse practitioner at his PCPs office who thought it was a cellulitis of the right lower extremity and put the patient on Augmentin and sent him to the wound clinic. Associated Signs and Symptoms: Patient reports having:a lot of itching and burning in the region. 74 year old gentleman who saw the nurse practitioner at his PCPs office recently  and was diagnosed with a right lower extremity cellulitis which arose from an abrasion which she's had for about 3 weeks. The patient was put on Augmentin and sent to see [place] for an opinion. His past medical history significant for diabetes mellitus type 2, coronary artery disease, hypertension, status post cholecystectomy, colonoscope he and status post stomach surgery. He is a reformed smoker and has quit smoking in 1987 and at the same time stopped drinking alcohol. An x-ray of the right tibia and fibula was recommended but not has not been done. Wound History Patient presents with 2 open wounds that have been present for approximately 3-4 weeks. Patient has been treating wounds in the following manner: open to air. Laboratory tests have not been performed in the last month. Patient reportedly has not tested positive for an antibiotic resistant organism. Patient reportedly has not tested positive for osteomyelitis. Patient reportedly has not had testing performed to evaluate circulation in the legs.  Patient experiences the following problems associated with their wounds: swelling. Patient History Information obtained from Patient. Allergies Paul Santos, Paul Santos ([id number]) No Known Allergies Family History Diabetes - Mother, Stroke - Father, No family history of Cancer, Heart Disease, Hereditary Spherocytosis, Hypertension, Kidney Disease, Lung Disease, Seizures, Thyroid Problems, Tuberculosis. Social History Former smoker - quit 30 years ago, Marital Status - Married, Alcohol Use - Daily - quit in 1985, Drug Use - No History, Caffeine Use - Never. Medical History Eyes Patient has history of Glaucoma Cardiovascular Patient has history of Hypertension Endocrine Patient has history of Type II Diabetes Patient is treated with Oral Agents. Blood sugar is not tested. Medical And Surgical History Notes Ear/Nose/Mouth/Throat HOH - hearing aids Psychiatric alcoholism - quit drinking in 1985 Review of Systems (ROS) Constitutional Symptoms (General Health) The patient has no complaints or symptoms. Eyes The patient has no complaints or symptoms. Ear/Nose/Mouth/Throat The patient has no complaints or symptoms. Hematologic/Lymphatic The patient has no complaints or symptoms. Respiratory The patient has no complaints or symptoms. Cardiovascular The patient has no complaints or symptoms. Gastrointestinal The patient has no complaints or symptoms. Endocrine The patient has no complaints or symptoms. Genitourinary The patient has no complaints or symptoms. Immunological The patient has no complaints or symptoms. Integumentary (Skin) The patient has no complaints or symptoms. Paul Santos, Paul Santos ([id number]) Musculoskeletal The patient has no complaints or symptoms. Neurologic The patient has no complaints or symptoms. Oncologic The patient has no complaints or symptoms. Psychiatric The patient has no complaints or symptoms. Medications aspirin 325 mg tablet oral 1 1 tablet  oral telmisartan 80 mg-hydrochlorothiazide 25 mg tablet oral 1 1 tablet oral metformin 500 mg tablet oral 1 1 tablet oral atorvastatin 20 mg tablet oral 1 1 tablet oral benazepril 20 mg tablet oral 1 1 tablet oral amlodipine 10 mg tablet oral 1 1 tablet oral ferrous sulfate 325 mg (65 mg iron) tablet oral 1 1 tablet oral timolol maleate 0.5 % eye drops ophthalmic drops ophthalmic fluticasone 50 mcg/actuation nasal spray,suspension nasal spray,suspension nasal meloxicam 15 mg tablet oral 1 1 tablet oral methocarbamol 750 mg tablet oral 1 1 tablet oral cyanocobalamin (vit B-12) 1,000 mcg tablet oral 2 2 tablet oral Objective Constitutional Pulse regular. Respirations normal and unlabored. Afebrile. Vitals Time Taken: 1:57 PM, Height: 68 in, Source: Stated, Weight: 180 lbs, Source: Stated, BMI: 27.4, Temperature: 97.5 F, Pulse: 74 bpm, Respiratory Rate: 18 breaths/min, Blood Pressure: 134/63 mmHg. Eyes Nonicteric. Reactive to light. Ears, Nose, Mouth, and Throat Lips, teeth, and gums WNL.Marland Kitchen Moist  mucosa without lesions . Neck supple and nontender. No palpable supraclavicular or cervical adenopathy. Normal sized without goiter. Respiratory [place], [place] ([id number]) WNL. No retractions.. Breath sounds WNL, No rubs, rales, rhonchi, or wheeze.. Cardiovascular Pedal Pulses WNL. ABI on the right is 1.14 and the left is 1.12. no evidence of lower extremity edema. Gastrointestinal (GI) Abdomen without masses or tenderness.. No liver or spleen enlargement or tenderness.. Lymphatic No adneopathy. No adenopathy. No adenopathy. Musculoskeletal Adexa without tenderness or enlargement.. Digits and nails w/o clubbing, cyanosis, infection, petechiae, ischemia, or inflammatory conditions.Marland Kitchen Psychiatric Judgement and insight Intact.. No evidence of depression, anxiety, or agitation.. General Notes: the area on his right lower extremity has significant amount of scaly skin and then a epidermal  layer which is pinkish which looks like recent eczema and minimal weeping. This is not a fungal infection nor is it a open ulceration or cellulitis. Integumentary (Hair, Skin) the area on his right lower extremity has significant amount of scaly skin and then a epidermal layer which is pinkish which looks like recent eczema and minimal weeping.Marland Kitchen No crepitus or fluctuance. No peri-wound warmth or erythema. No masses.. Wound #1 status is Open. Original cause of wound was Gradually Appeared. The wound is located on the Right,Circumferential Lower Leg. The wound measures 23cm length x 33cm width x 0.1cm depth; 596.117cm^2 area and 59.612cm^3 volume. The wound is limited to skin breakdown. There is no tunneling or undermining noted. There is a large amount of serous drainage noted. The wound margin is flat and intact. There is medium (34-66%) red, pink granulation within the wound bed. There is a medium (34-66%) amount of necrotic tissue within the wound bed including Adherent Slough. The periwound skin appearance did not exhibit: Callus, Crepitus, Excoriation, Fluctuance, Friable, Induration, Localized Edema, Rash, Scarring, Dry/Scaly, Maceration, Moist, Atrophie Blanche, Cyanosis, Ecchymosis, Hemosiderin Staining, Mottled, Pallor, Rubor, Erythema. The periwound has tenderness on palpation. 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 ([id number]) This 74 year old diabetic does not have evidence of cellulitis, fungal inflammation or lymphedema of the right lower extremity. This is a eczema which is beyond the scope of my practice but he needs to see a dermatologist for further care. [place] is already on an antibiotic atorvastatin to complete his course and in the meanwhile till he gets his appointment with the dermatologist have asked him to  moisturize this is masses possible so that the scaly skin will not cause him much itching. He will get in touch with his PCP Dr. Tawnya Crook and will ask for an appropriate referral. We have not given him a return visit but if all else fails, he can come back to see as for symptomatic treatment. Plan Discharge From [facility] Services: Wound #1 Right,Circumferential Lower Leg: Discharge from [place] - consult only - need to follow up with dermatologist This 74 year old diabetic does not have evidence of cellulitis, fungal inflammation or lymphedema of the right lower extremity. This is a eczema which is beyond the scope of my practice but he needs to see a dermatologist for further care. [place] is already on an antibiotic atorvastatin to complete his course and in the meanwhile till he gets his appointment with the dermatologist have asked him to moisturize this is masses possible so that the scaly skin will not cause him much itching. He will get in touch with his PCP Dr.  Tawnya Crook and will ask for an appropriate referral. We have not given him a return visit but if all else fails, he can come back to see as for symptomatic treatment. Electronic Signature(s) Signed: 01/05/2015 4:31:13 PM By: Christin Fudge MD, FACS Previous Signature: 01/04/2015 3:16:12 PM Version By: Christin Fudge MD, FACS Entered By: Christin Fudge on 01/05/2015 16:31:13 Paul Santos ([id number]) -------------------------------------------------------------------------------- ROS/PFSH Details Patient Name: Paul Santos Date of Service: 01/04/2015 1:30 PM Medical Record Number: [id number] Patient Account Number: [id number] Date of Birth/Sex: [birth year] (74 y.o. Male) Treating RN: Montey Hora Primary Care Physician: [place] Pink Other Clinician: Referring Physician: [place] Pink Treating Physician/Extender: Frann Rider in Treatment: 0 Information Obtained From Patient Wound  History Do you currently have one or more open woundso Yes How many open wounds do you currently haveo 2 Approximately how long have you had your woundso 3-4 weeks How have you been treating your wound(s) until nowo open to air Has your wound(s) ever healed and then re-openedo No Have you had any lab work done in the past montho No Have you tested positive for an antibiotic resistant organism (MRSA, VRE)o No Have you tested positive for osteomyelitis (bone infection)o No Have you had any tests for circulation on your legso No Have you had other problems associated with your woundso Swelling Constitutional Symptoms (General Health) Complaints and Symptoms: No Complaints or Symptoms Eyes Complaints and Symptoms: No Complaints or Symptoms Medical History: Positive for: Glaucoma Ear/Nose/Mouth/Throat Complaints and Symptoms: No Complaints or Symptoms Medical History: Past Medical History Notes: HOH - hearing aids Hematologic/Lymphatic Complaints and Symptoms: No Complaints or Symptoms Respiratory Paul Santos, Paul Santos ([id number]) Complaints and Symptoms: No Complaints or Symptoms Cardiovascular Complaints and Symptoms: No Complaints or Symptoms Medical History: Positive for: Hypertension Gastrointestinal Complaints and Symptoms: No Complaints or Symptoms Endocrine Complaints and Symptoms: No Complaints or Symptoms Medical History: Positive for: Type II Diabetes Treated with: Oral agents Blood sugar tested every day: No Genitourinary Complaints and Symptoms: No Complaints or Symptoms Immunological Complaints and Symptoms: No Complaints or Symptoms Integumentary (Skin) Complaints and Symptoms: No Complaints or Symptoms Musculoskeletal Complaints and Symptoms: No Complaints or Symptoms Neurologic Complaints and Symptoms: No Complaints or Symptoms Paul Santos, Paul W. ([id number]) Oncologic Complaints and Symptoms: No Complaints or Symptoms Psychiatric Complaints  and Symptoms: No Complaints or Symptoms Medical History: Past Medical History Notes: alcoholism - quit drinking in 1985 HBO Extended History Items Eyes: Glaucoma Immunizations Immunization Notes: up to date Family and Social History Cancer: No; Diabetes: Yes - Mother; Heart Disease: No; Hereditary Spherocytosis: No; Hypertension: No; Kidney Disease: No; Lung Disease: No; Seizures: No; Stroke: Yes - Father; Thyroid Problems: No; Tuberculosis: No; Former smoker - quit 30 years ago; Marital Status - Married; Alcohol Use: Daily - quit in 1985; Drug Use: No History; Caffeine Use: Never; Financial Concerns: No; Food, Clothing or Shelter Needs: No; Support System Lacking: No; Transportation Concerns: No; Advanced Directives: No; Patient does not want information on Advanced Directives Physician Affirmation I have reviewed and agree with the above information. Electronic Signature(s) Signed: 01/04/2015 2:10:10 PM By: Christin Fudge MD, FACS Signed: 01/04/2015 5:39:20 PM By: Montey Hora Entered By: Christin Fudge on 01/04/2015 14:10:10 Paul Santos ([id number]) -------------------------------------------------------------------------------- SuperBill Details Patient Name: Paul Santos Date of Service: 01/04/2015 Medical Record Number: [id number] Patient Account Number: [id number] Date of Birth/Sex: 1940-12-24 (74 y.o. 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 Facility Procedures CPT4 Code: [id number] Description: 99214 - WOUND CARE VISIT-LEV 4 EST PT Modifier: Quantity: 1 Physician Procedures CPT4 Code: [id number] Description: [id number]  - WC PHYS LEVEL 4 - NEW PT ICD-10 Description Diagnosis E11.622 Type 2 diabetes mellitus with other skin ulcer E11.620 Type 2 diabetes mellitus with diabetic dermatit 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

## 2015-01-05 NOTE — Progress Notes (Signed)
Paul Santos ([id number]) Visit Report for 01/04/2015 Abuse/Suicide Risk Screen Details Patient Name: Paul Santos Date of Service: 01/04/2015 1:30 PM Medical Record Number: [id number] Patient Account Number: [id number] Date of Birth/Sex: 1940-09-06 (74 y.o. Male) Treating RN: Montey Hora Primary Care Physician: [place] Pink Other Clinician: Referring Physician: [place] Pink Treating Physician/Extender: Frann Rider in Treatment: 0 Abuse/Suicide Risk Screen Items Answer ABUSE/SUICIDE RISK SCREEN: Has anyone close to you tried to hurt or harm you recentlyo No Do you feel uncomfortable with anyone in your familyo No Has anyone forced you do things that you didnot want to doo No Do you have any thoughts of harming yourselfo No Patient displays signs or symptoms of abuse and/or neglect. No Electronic Signature(s) Signed: 01/04/2015 5:39:20 PM By: Montey Hora Entered By: Montey Hora on 01/04/2015 14:07:30 Paul Santos ([id number]) -------------------------------------------------------------------------------- Activities of Daily Living Details Patient Name: Paul Santos Date of Service: 01/04/2015 1:30 PM Medical Record Number: [id number] Patient Account Number: [id number] Date of Birth/Sex: 29-Sep-1940 (74 y.o. Male) Treating RN: Montey Hora Primary Care Physician: Maryland Pink Other Clinician: Referring Physician: [place] Pink Treating Physician/Extender: Frann Rider in Treatment: 0 Activities of Daily Living Items Answer Activities of Daily Living (Please select one for each item) Drive Automobile Completely Able Take Medications Completely Able Use Telephone Completely [place] for Appearance Completely Able Use Toilet Completely Able Bath / Shower Completely Able Dress Self Completely Able Feed Self Completely Able Walk Completely Able Get In / Out Bed Completely Able Housework Completely Able Prepare Meals  Completely [place] for Self Completely Able Electronic Signature(s) Signed: 01/04/2015 5:39:20 PM By: Montey Hora Entered By: Montey Hora on 01/04/2015 14:07:51 Paul Santos ([id number]) -------------------------------------------------------------------------------- Education Assessment Details Patient Name: Paul Santos Date of Service: 01/04/2015 1:30 PM Medical Record Number: [id number] Patient Account Number: [id number] Date of Birth/Sex: 1940-07-13 (74 y.o. Male) Treating RN: Montey Hora Primary Care Physician: Maryland Pink Other Clinician: Referring Physician: [place] Pink Treating Physician/Extender: Frann Rider in Treatment: 0 Primary Learner Assessed: Patient Learning Preferences/Education Level/Primary Language Learning Preference: Explanation, Demonstration Highest Education Level: High School Preferred Language: English Cognitive Barrier Assessment/Beliefs Language Barrier: No Translator Needed: No Memory Deficit: No Emotional Barrier: No Cultural/Religious Beliefs Affecting Medical No Care: Physical Barrier Assessment Impaired Vision: No Impaired Hearing: Yes Hearing Aid Decreased Hand dexterity: No Knowledge/Comprehension Assessment Knowledge Level: Medium Comprehension Level: Medium Ability to understand written Medium instructions: Ability to understand verbal Medium instructions: Motivation Assessment Anxiety Level: Calm Cooperation: Cooperative Education Importance: Acknowledges Need Interest in Health Problems: Asks Questions Perception: Coherent Willingness to Engage in Self- Medium Management Activities: Readiness to Engage in Self- Medium Management Activities: Electronic Signature(s) Paul Santos ([id number]) Signed: 01/04/2015 5:39:20 PM By: Montey Hora Entered By: Montey Hora on 01/04/2015 14:08:27 Paul Santos  ([id number]) -------------------------------------------------------------------------------- Fall Risk Assessment Details Patient Name: Paul Santos Date of Service: 01/04/2015 1:30 PM Medical Record Number: [id number] Patient Account Number: [id number] Date of Birth/Sex: [birth year] (74 y.o. Male) Treating RN: Montey Hora Primary Care Physician: Maryland Pink Other Clinician: Referring Physician: [place] Pink Treating Physician/Extender: Frann Rider in Treatment: 0 Fall Risk Assessment Items Have you had 2 or more falls in the last 12 monthso 0 No Have you had any fall that resulted in injury in the last 12 monthso 0 No FALL RISK ASSESSMENT: History of falling - immediate or within 3 months 0 No Secondary diagnosis 0 No Ambulatory aid None/bed rest/wheelchair/nurse 0 Yes Crutches/cane/walker 0 No  Furniture 0 No IV Access/Saline Lock 0 No Gait/Training Normal/bed rest/immobile 0 Yes Weak 0 No Impaired 0 No Mental Status Oriented to own ability 0 Yes Electronic Signature(s) Signed: 01/04/2015 5:39:20 PM By: Montey Hora Entered By: Montey Hora on 01/04/2015 14:08:46 Paul Santos ([id number]) -------------------------------------------------------------------------------- Foot Assessment Details Patient Name: Paul Santos Date of Service: 01/04/2015 1:30 PM Medical Record Number: [id number] Patient Account Number: [id number] Date of Birth/Sex: 12-Feb-1940 (74 y.o. Male) Treating RN: Montey Hora Primary Care Physician: [place] Pink Other Clinician: Referring Physician: [place] Pink Treating Physician/Extender: Frann Rider in Treatment: 0 Foot Assessment Items Site Locations + = Sensation present, - = Sensation absent, C = Callus, U = Ulcer R = Redness, W = Warmth, M = Maceration, PU = Pre-ulcerative lesion F = Fissure, S = Swelling, D = Dryness Assessment Right: Left: Other Deformity: No No Prior Foot Ulcer: No No Prior  Amputation: No No Charcot Joint: No No Ambulatory Status: Ambulatory Without Help Gait: Steady Electronic Signature(s) Signed: 01/04/2015 5:39:20 PM By: Montey Hora Entered By: Montey Hora on 01/04/2015 14:09:12 Paul Santos ([id number]) -------------------------------------------------------------------------------- Nutrition Risk Assessment Details Patient Name: Paul Santos Date of Service: 01/04/2015 1:30 PM Medical Record Number: [id number] Patient Account Number: [id number] Date of Birth/Sex: Nov 12, 1940 (74 y.o. Male) Treating RN: Montey Hora Primary Care Physician: Maryland Pink Other Clinician: Referring Physician: [place] Pink Treating Physician/Extender: Frann Rider in Treatment: 0 Height (in): 68 Weight (lbs): 180 Body Mass Index (BMI): 27.4 Nutrition Risk Assessment Items NUTRITION RISK SCREEN: I have an illness or condition that made me change the kind and/or 0 No amount of food I eat I eat fewer than two meals per day 0 No I eat few fruits and vegetables, or milk products 0 No I have three or more drinks of beer, liquor or wine almost every day 0 No I have tooth or mouth problems that make it hard for me to eat 0 No I don't always have enough money to buy the food I need 0 No I eat alone most of the time 0 No I take three or more different prescribed or over-the-counter drugs a 1 Yes day Without wanting to, I have lost or gained 10 pounds in the last six 0 No months I am not always physically able to shop, cook and/or feed myself 0 No Nutrition Protocols Good Risk Protocol 0 No interventions needed Moderate Risk Protocol Electronic Signature(s) Signed: 01/04/2015 5:39:20 PM By: Montey Hora Entered By: Montey Hora on 01/04/2015 14:08:52

## 2015-01-24 DIAGNOSIS — H401132 Primary open-angle glaucoma, bilateral, moderate stage: Secondary | ICD-10-CM | POA: Diagnosis not present

## 2015-01-25 DIAGNOSIS — L989 Disorder of the skin and subcutaneous tissue, unspecified: Secondary | ICD-10-CM | POA: Diagnosis not present

## 2015-01-25 DIAGNOSIS — L570 Actinic keratosis: Secondary | ICD-10-CM | POA: Diagnosis not present

## 2015-01-25 DIAGNOSIS — E119 Type 2 diabetes mellitus without complications: Secondary | ICD-10-CM | POA: Diagnosis not present

## 2015-01-25 DIAGNOSIS — Z125 Encounter for screening for malignant neoplasm of prostate: Secondary | ICD-10-CM | POA: Diagnosis not present

## 2015-01-25 DIAGNOSIS — I10 Essential (primary) hypertension: Secondary | ICD-10-CM | POA: Diagnosis not present

## 2015-01-25 DIAGNOSIS — E785 Hyperlipidemia, unspecified: Secondary | ICD-10-CM | POA: Diagnosis not present

## 2015-02-01 DIAGNOSIS — H401132 Primary open-angle glaucoma, bilateral, moderate stage: Secondary | ICD-10-CM | POA: Diagnosis not present

## 2015-02-23 DIAGNOSIS — E782 Mixed hyperlipidemia: Secondary | ICD-10-CM | POA: Diagnosis not present

## 2015-02-23 DIAGNOSIS — I272 Other secondary pulmonary hypertension: Secondary | ICD-10-CM | POA: Diagnosis not present

## 2015-02-23 DIAGNOSIS — I482 Chronic atrial fibrillation: Secondary | ICD-10-CM | POA: Diagnosis not present

## 2015-02-23 DIAGNOSIS — I10 Essential (primary) hypertension: Secondary | ICD-10-CM | POA: Diagnosis not present

## 2015-02-27 DIAGNOSIS — H401132 Primary open-angle glaucoma, bilateral, moderate stage: Secondary | ICD-10-CM | POA: Diagnosis not present

## 2015-03-05 DIAGNOSIS — E538 Deficiency of other specified B group vitamins: Secondary | ICD-10-CM | POA: Diagnosis not present

## 2015-03-05 DIAGNOSIS — Z862 Personal history of diseases of the blood and blood-forming organs and certain disorders involving the immune mechanism: Secondary | ICD-10-CM | POA: Diagnosis not present

## 2015-03-06 DIAGNOSIS — I10 Essential (primary) hypertension: Secondary | ICD-10-CM | POA: Diagnosis not present

## 2015-03-06 DIAGNOSIS — R0602 Shortness of breath: Secondary | ICD-10-CM | POA: Diagnosis not present

## 2015-03-06 DIAGNOSIS — R5383 Other fatigue: Secondary | ICD-10-CM | POA: Diagnosis not present

## 2015-03-06 DIAGNOSIS — D508 Other iron deficiency anemias: Secondary | ICD-10-CM | POA: Diagnosis not present

## 2015-03-06 DIAGNOSIS — R5381 Other malaise: Secondary | ICD-10-CM | POA: Diagnosis not present

## 2015-04-04 DIAGNOSIS — L400 Psoriasis vulgaris: Secondary | ICD-10-CM | POA: Diagnosis not present

## 2015-04-23 DIAGNOSIS — L400 Psoriasis vulgaris: Secondary | ICD-10-CM | POA: Diagnosis not present

## 2015-04-23 DIAGNOSIS — L570 Actinic keratosis: Secondary | ICD-10-CM | POA: Diagnosis not present

## 2015-04-23 DIAGNOSIS — X32XXXA Exposure to sunlight, initial encounter: Secondary | ICD-10-CM | POA: Diagnosis not present

## 2015-05-14 DIAGNOSIS — H401132 Primary open-angle glaucoma, bilateral, moderate stage: Secondary | ICD-10-CM | POA: Diagnosis not present

## 2015-05-18 DIAGNOSIS — E119 Type 2 diabetes mellitus without complications: Secondary | ICD-10-CM | POA: Diagnosis not present

## 2015-05-18 DIAGNOSIS — Z125 Encounter for screening for malignant neoplasm of prostate: Secondary | ICD-10-CM | POA: Diagnosis not present

## 2015-05-18 DIAGNOSIS — I10 Essential (primary) hypertension: Secondary | ICD-10-CM | POA: Diagnosis not present

## 2015-05-28 DIAGNOSIS — E119 Type 2 diabetes mellitus without complications: Secondary | ICD-10-CM | POA: Diagnosis not present

## 2015-05-28 DIAGNOSIS — Z0000 Encounter for general adult medical examination without abnormal findings: Secondary | ICD-10-CM | POA: Diagnosis not present

## 2015-05-28 DIAGNOSIS — H6691 Otitis media, unspecified, right ear: Secondary | ICD-10-CM | POA: Diagnosis not present

## 2015-05-28 DIAGNOSIS — D649 Anemia, unspecified: Secondary | ICD-10-CM | POA: Diagnosis not present

## 2015-05-28 DIAGNOSIS — I10 Essential (primary) hypertension: Secondary | ICD-10-CM | POA: Diagnosis not present

## 2015-06-12 DIAGNOSIS — H401132 Primary open-angle glaucoma, bilateral, moderate stage: Secondary | ICD-10-CM | POA: Diagnosis not present

## 2015-07-25 DIAGNOSIS — I8311 Varicose veins of right lower extremity with inflammation: Secondary | ICD-10-CM | POA: Diagnosis not present

## 2015-07-25 DIAGNOSIS — L570 Actinic keratosis: Secondary | ICD-10-CM | POA: Diagnosis not present

## 2015-07-25 DIAGNOSIS — I8312 Varicose veins of left lower extremity with inflammation: Secondary | ICD-10-CM | POA: Diagnosis not present

## 2015-07-25 DIAGNOSIS — X32XXXA Exposure to sunlight, initial encounter: Secondary | ICD-10-CM | POA: Diagnosis not present

## 2015-07-25 DIAGNOSIS — L400 Psoriasis vulgaris: Secondary | ICD-10-CM | POA: Diagnosis not present

## 2015-08-23 DIAGNOSIS — I2510 Atherosclerotic heart disease of native coronary artery without angina pectoris: Secondary | ICD-10-CM | POA: Diagnosis not present

## 2015-08-23 DIAGNOSIS — I119 Hypertensive heart disease without heart failure: Secondary | ICD-10-CM | POA: Diagnosis not present

## 2015-08-23 DIAGNOSIS — R42 Dizziness and giddiness: Secondary | ICD-10-CM | POA: Diagnosis not present

## 2015-08-23 DIAGNOSIS — I10 Essential (primary) hypertension: Secondary | ICD-10-CM | POA: Diagnosis not present

## 2015-08-29 DIAGNOSIS — H60339 Swimmer's ear, unspecified ear: Secondary | ICD-10-CM | POA: Diagnosis not present

## 2015-08-29 DIAGNOSIS — H60332 Swimmer's ear, left ear: Secondary | ICD-10-CM | POA: Diagnosis not present

## 2015-09-10 DIAGNOSIS — R42 Dizziness and giddiness: Secondary | ICD-10-CM | POA: Diagnosis not present

## 2015-09-10 DIAGNOSIS — I482 Chronic atrial fibrillation: Secondary | ICD-10-CM | POA: Diagnosis not present

## 2015-09-10 DIAGNOSIS — R238 Other skin changes: Secondary | ICD-10-CM | POA: Diagnosis not present

## 2015-09-10 DIAGNOSIS — I350 Nonrheumatic aortic (valve) stenosis: Secondary | ICD-10-CM | POA: Diagnosis not present

## 2015-09-10 DIAGNOSIS — I2510 Atherosclerotic heart disease of native coronary artery without angina pectoris: Secondary | ICD-10-CM | POA: Diagnosis not present

## 2015-09-10 DIAGNOSIS — I119 Hypertensive heart disease without heart failure: Secondary | ICD-10-CM | POA: Diagnosis not present

## 2015-09-21 DIAGNOSIS — E119 Type 2 diabetes mellitus without complications: Secondary | ICD-10-CM | POA: Diagnosis not present

## 2015-10-01 DIAGNOSIS — L989 Disorder of the skin and subcutaneous tissue, unspecified: Secondary | ICD-10-CM | POA: Diagnosis not present

## 2015-10-01 DIAGNOSIS — E785 Hyperlipidemia, unspecified: Secondary | ICD-10-CM | POA: Diagnosis not present

## 2015-10-01 DIAGNOSIS — D649 Anemia, unspecified: Secondary | ICD-10-CM | POA: Diagnosis not present

## 2015-10-01 DIAGNOSIS — E119 Type 2 diabetes mellitus without complications: Secondary | ICD-10-CM | POA: Diagnosis not present

## 2015-10-01 DIAGNOSIS — I10 Essential (primary) hypertension: Secondary | ICD-10-CM | POA: Diagnosis not present

## 2015-10-11 DIAGNOSIS — H401132 Primary open-angle glaucoma, bilateral, moderate stage: Secondary | ICD-10-CM | POA: Diagnosis not present

## 2015-12-07 DIAGNOSIS — H401132 Primary open-angle glaucoma, bilateral, moderate stage: Secondary | ICD-10-CM | POA: Diagnosis not present

## 2015-12-19 DIAGNOSIS — H401132 Primary open-angle glaucoma, bilateral, moderate stage: Secondary | ICD-10-CM | POA: Diagnosis not present

## 2016-01-28 DIAGNOSIS — L400 Psoriasis vulgaris: Secondary | ICD-10-CM | POA: Diagnosis not present

## 2016-01-28 DIAGNOSIS — D044 Carcinoma in situ of skin of scalp and neck: Secondary | ICD-10-CM | POA: Diagnosis not present

## 2016-01-28 DIAGNOSIS — D649 Anemia, unspecified: Secondary | ICD-10-CM | POA: Diagnosis not present

## 2016-01-28 DIAGNOSIS — L820 Inflamed seborrheic keratosis: Secondary | ICD-10-CM | POA: Diagnosis not present

## 2016-01-28 DIAGNOSIS — E119 Type 2 diabetes mellitus without complications: Secondary | ICD-10-CM | POA: Diagnosis not present

## 2016-01-28 DIAGNOSIS — D485 Neoplasm of uncertain behavior of skin: Secondary | ICD-10-CM | POA: Diagnosis not present

## 2016-01-28 DIAGNOSIS — L570 Actinic keratosis: Secondary | ICD-10-CM | POA: Diagnosis not present

## 2016-01-28 DIAGNOSIS — X32XXXA Exposure to sunlight, initial encounter: Secondary | ICD-10-CM | POA: Diagnosis not present

## 2016-02-04 DIAGNOSIS — E119 Type 2 diabetes mellitus without complications: Secondary | ICD-10-CM | POA: Diagnosis not present

## 2016-02-04 DIAGNOSIS — R634 Abnormal weight loss: Secondary | ICD-10-CM | POA: Diagnosis not present

## 2016-02-04 DIAGNOSIS — Z125 Encounter for screening for malignant neoplasm of prostate: Secondary | ICD-10-CM | POA: Diagnosis not present

## 2016-02-04 DIAGNOSIS — M7551 Bursitis of right shoulder: Secondary | ICD-10-CM | POA: Diagnosis not present

## 2016-02-04 DIAGNOSIS — D649 Anemia, unspecified: Secondary | ICD-10-CM | POA: Diagnosis not present

## 2016-02-04 DIAGNOSIS — I10 Essential (primary) hypertension: Secondary | ICD-10-CM | POA: Diagnosis not present

## 2016-02-12 DIAGNOSIS — D044 Carcinoma in situ of skin of scalp and neck: Secondary | ICD-10-CM | POA: Diagnosis not present

## 2016-02-29 DIAGNOSIS — I482 Chronic atrial fibrillation: Secondary | ICD-10-CM | POA: Diagnosis not present

## 2016-02-29 DIAGNOSIS — I2510 Atherosclerotic heart disease of native coronary artery without angina pectoris: Secondary | ICD-10-CM | POA: Diagnosis not present

## 2016-02-29 DIAGNOSIS — I340 Nonrheumatic mitral (valve) insufficiency: Secondary | ICD-10-CM | POA: Diagnosis not present

## 2016-02-29 DIAGNOSIS — I10 Essential (primary) hypertension: Secondary | ICD-10-CM | POA: Diagnosis not present

## 2016-03-11 DIAGNOSIS — H401132 Primary open-angle glaucoma, bilateral, moderate stage: Secondary | ICD-10-CM | POA: Diagnosis not present

## 2016-05-29 DIAGNOSIS — E119 Type 2 diabetes mellitus without complications: Secondary | ICD-10-CM | POA: Diagnosis not present

## 2016-05-29 DIAGNOSIS — Z0000 Encounter for general adult medical examination without abnormal findings: Secondary | ICD-10-CM | POA: Diagnosis not present

## 2016-05-29 DIAGNOSIS — Z125 Encounter for screening for malignant neoplasm of prostate: Secondary | ICD-10-CM | POA: Diagnosis not present

## 2016-06-05 DIAGNOSIS — M25511 Pain in right shoulder: Secondary | ICD-10-CM | POA: Diagnosis not present

## 2016-06-05 DIAGNOSIS — Z0000 Encounter for general adult medical examination without abnormal findings: Secondary | ICD-10-CM | POA: Diagnosis not present

## 2016-06-05 DIAGNOSIS — E119 Type 2 diabetes mellitus without complications: Secondary | ICD-10-CM | POA: Diagnosis not present

## 2016-06-05 DIAGNOSIS — I10 Essential (primary) hypertension: Secondary | ICD-10-CM | POA: Diagnosis not present

## 2016-06-11 DIAGNOSIS — L820 Inflamed seborrheic keratosis: Secondary | ICD-10-CM | POA: Diagnosis not present

## 2016-06-11 DIAGNOSIS — D2271 Melanocytic nevi of right lower limb, including hip: Secondary | ICD-10-CM | POA: Diagnosis not present

## 2016-06-11 DIAGNOSIS — D485 Neoplasm of uncertain behavior of skin: Secondary | ICD-10-CM | POA: Diagnosis not present

## 2016-06-11 DIAGNOSIS — L570 Actinic keratosis: Secondary | ICD-10-CM | POA: Diagnosis not present

## 2016-06-11 DIAGNOSIS — C44629 Squamous cell carcinoma of skin of left upper limb, including shoulder: Secondary | ICD-10-CM | POA: Diagnosis not present

## 2016-06-11 DIAGNOSIS — D692 Other nonthrombocytopenic purpura: Secondary | ICD-10-CM | POA: Diagnosis not present

## 2016-06-11 DIAGNOSIS — L400 Psoriasis vulgaris: Secondary | ICD-10-CM | POA: Diagnosis not present

## 2016-06-11 DIAGNOSIS — X32XXXA Exposure to sunlight, initial encounter: Secondary | ICD-10-CM | POA: Diagnosis not present

## 2016-06-18 DIAGNOSIS — H401132 Primary open-angle glaucoma, bilateral, moderate stage: Secondary | ICD-10-CM | POA: Diagnosis not present

## 2016-06-26 DIAGNOSIS — C44629 Squamous cell carcinoma of skin of left upper limb, including shoulder: Secondary | ICD-10-CM | POA: Diagnosis not present

## 2016-06-26 DIAGNOSIS — L905 Scar conditions and fibrosis of skin: Secondary | ICD-10-CM | POA: Diagnosis not present

## 2016-09-03 DIAGNOSIS — I2510 Atherosclerotic heart disease of native coronary artery without angina pectoris: Secondary | ICD-10-CM | POA: Diagnosis not present

## 2016-09-03 DIAGNOSIS — I482 Chronic atrial fibrillation: Secondary | ICD-10-CM | POA: Diagnosis not present

## 2016-09-03 DIAGNOSIS — R0602 Shortness of breath: Secondary | ICD-10-CM | POA: Diagnosis not present

## 2016-09-03 DIAGNOSIS — I6523 Occlusion and stenosis of bilateral carotid arteries: Secondary | ICD-10-CM | POA: Diagnosis not present

## 2016-09-03 DIAGNOSIS — I340 Nonrheumatic mitral (valve) insufficiency: Secondary | ICD-10-CM | POA: Diagnosis not present

## 2016-09-08 DIAGNOSIS — I482 Chronic atrial fibrillation: Secondary | ICD-10-CM | POA: Diagnosis not present

## 2016-09-12 DIAGNOSIS — H401113 Primary open-angle glaucoma, right eye, severe stage: Secondary | ICD-10-CM | POA: Diagnosis not present

## 2016-09-29 DIAGNOSIS — E119 Type 2 diabetes mellitus without complications: Secondary | ICD-10-CM | POA: Diagnosis not present

## 2016-10-01 DIAGNOSIS — R0602 Shortness of breath: Secondary | ICD-10-CM | POA: Diagnosis not present

## 2016-10-06 DIAGNOSIS — E785 Hyperlipidemia, unspecified: Secondary | ICD-10-CM | POA: Diagnosis not present

## 2016-10-06 DIAGNOSIS — E119 Type 2 diabetes mellitus without complications: Secondary | ICD-10-CM | POA: Diagnosis not present

## 2016-10-06 DIAGNOSIS — I10 Essential (primary) hypertension: Secondary | ICD-10-CM | POA: Diagnosis not present

## 2016-10-06 DIAGNOSIS — I482 Chronic atrial fibrillation: Secondary | ICD-10-CM | POA: Diagnosis not present

## 2016-10-20 DIAGNOSIS — R0602 Shortness of breath: Secondary | ICD-10-CM | POA: Diagnosis not present

## 2016-10-20 DIAGNOSIS — I495 Sick sinus syndrome: Secondary | ICD-10-CM | POA: Diagnosis not present

## 2016-10-20 DIAGNOSIS — I2510 Atherosclerotic heart disease of native coronary artery without angina pectoris: Secondary | ICD-10-CM | POA: Diagnosis not present

## 2016-10-20 DIAGNOSIS — I6523 Occlusion and stenosis of bilateral carotid arteries: Secondary | ICD-10-CM | POA: Diagnosis not present

## 2016-11-06 DIAGNOSIS — Z08 Encounter for follow-up examination after completed treatment for malignant neoplasm: Secondary | ICD-10-CM | POA: Diagnosis not present

## 2016-11-06 DIAGNOSIS — D485 Neoplasm of uncertain behavior of skin: Secondary | ICD-10-CM | POA: Diagnosis not present

## 2016-11-06 DIAGNOSIS — L923 Foreign body granuloma of the skin and subcutaneous tissue: Secondary | ICD-10-CM | POA: Diagnosis not present

## 2016-11-06 DIAGNOSIS — L570 Actinic keratosis: Secondary | ICD-10-CM | POA: Diagnosis not present

## 2016-11-06 DIAGNOSIS — L905 Scar conditions and fibrosis of skin: Secondary | ICD-10-CM | POA: Diagnosis not present

## 2016-11-06 DIAGNOSIS — D044 Carcinoma in situ of skin of scalp and neck: Secondary | ICD-10-CM | POA: Diagnosis not present

## 2016-11-06 DIAGNOSIS — Z85828 Personal history of other malignant neoplasm of skin: Secondary | ICD-10-CM | POA: Diagnosis not present

## 2016-11-06 DIAGNOSIS — D692 Other nonthrombocytopenic purpura: Secondary | ICD-10-CM | POA: Diagnosis not present

## 2016-12-10 DIAGNOSIS — H401113 Primary open-angle glaucoma, right eye, severe stage: Secondary | ICD-10-CM | POA: Diagnosis not present

## 2016-12-16 DIAGNOSIS — H401133 Primary open-angle glaucoma, bilateral, severe stage: Secondary | ICD-10-CM | POA: Diagnosis not present

## 2016-12-25 DIAGNOSIS — D049 Carcinoma in situ of skin, unspecified: Secondary | ICD-10-CM | POA: Diagnosis not present

## 2017-01-27 DIAGNOSIS — H353131 Nonexudative age-related macular degeneration, bilateral, early dry stage: Secondary | ICD-10-CM | POA: Diagnosis not present

## 2017-01-29 DIAGNOSIS — E119 Type 2 diabetes mellitus without complications: Secondary | ICD-10-CM | POA: Diagnosis not present

## 2017-02-05 DIAGNOSIS — E119 Type 2 diabetes mellitus without complications: Secondary | ICD-10-CM | POA: Diagnosis not present

## 2017-02-05 DIAGNOSIS — I10 Essential (primary) hypertension: Secondary | ICD-10-CM | POA: Diagnosis not present

## 2017-02-05 DIAGNOSIS — B028 Zoster with other complications: Secondary | ICD-10-CM | POA: Diagnosis not present

## 2017-02-05 DIAGNOSIS — E785 Hyperlipidemia, unspecified: Secondary | ICD-10-CM | POA: Diagnosis not present

## 2017-02-05 DIAGNOSIS — Z125 Encounter for screening for malignant neoplasm of prostate: Secondary | ICD-10-CM | POA: Diagnosis not present

## 2017-04-27 DIAGNOSIS — I2510 Atherosclerotic heart disease of native coronary artery without angina pectoris: Secondary | ICD-10-CM | POA: Diagnosis not present

## 2017-04-27 DIAGNOSIS — I10 Essential (primary) hypertension: Secondary | ICD-10-CM | POA: Diagnosis not present

## 2017-04-27 DIAGNOSIS — I119 Hypertensive heart disease without heart failure: Secondary | ICD-10-CM | POA: Diagnosis not present

## 2017-04-27 DIAGNOSIS — I482 Chronic atrial fibrillation: Secondary | ICD-10-CM | POA: Diagnosis not present

## 2017-04-27 DIAGNOSIS — I495 Sick sinus syndrome: Secondary | ICD-10-CM | POA: Diagnosis not present

## 2017-04-28 DIAGNOSIS — Z08 Encounter for follow-up examination after completed treatment for malignant neoplasm: Secondary | ICD-10-CM | POA: Diagnosis not present

## 2017-04-28 DIAGNOSIS — X32XXXA Exposure to sunlight, initial encounter: Secondary | ICD-10-CM | POA: Diagnosis not present

## 2017-04-28 DIAGNOSIS — L570 Actinic keratosis: Secondary | ICD-10-CM | POA: Diagnosis not present

## 2017-04-28 DIAGNOSIS — L821 Other seborrheic keratosis: Secondary | ICD-10-CM | POA: Diagnosis not present

## 2017-04-28 DIAGNOSIS — Z85828 Personal history of other malignant neoplasm of skin: Secondary | ICD-10-CM | POA: Diagnosis not present

## 2017-05-26 DIAGNOSIS — H401133 Primary open-angle glaucoma, bilateral, severe stage: Secondary | ICD-10-CM | POA: Diagnosis not present

## 2017-06-25 DIAGNOSIS — H401133 Primary open-angle glaucoma, bilateral, severe stage: Secondary | ICD-10-CM | POA: Diagnosis not present

## 2017-07-28 DIAGNOSIS — E785 Hyperlipidemia, unspecified: Secondary | ICD-10-CM | POA: Diagnosis not present

## 2017-07-28 DIAGNOSIS — E119 Type 2 diabetes mellitus without complications: Secondary | ICD-10-CM | POA: Diagnosis not present

## 2017-07-28 DIAGNOSIS — I10 Essential (primary) hypertension: Secondary | ICD-10-CM | POA: Diagnosis not present

## 2017-07-28 DIAGNOSIS — Z125 Encounter for screening for malignant neoplasm of prostate: Secondary | ICD-10-CM | POA: Diagnosis not present

## 2017-09-09 DIAGNOSIS — E785 Hyperlipidemia, unspecified: Secondary | ICD-10-CM | POA: Diagnosis not present

## 2017-09-09 DIAGNOSIS — R42 Dizziness and giddiness: Secondary | ICD-10-CM | POA: Diagnosis not present

## 2017-09-09 DIAGNOSIS — E119 Type 2 diabetes mellitus without complications: Secondary | ICD-10-CM | POA: Diagnosis not present

## 2017-09-09 DIAGNOSIS — I10 Essential (primary) hypertension: Secondary | ICD-10-CM | POA: Diagnosis not present

## 2017-10-27 DIAGNOSIS — H401133 Primary open-angle glaucoma, bilateral, severe stage: Secondary | ICD-10-CM | POA: Diagnosis not present

## 2017-11-03 DIAGNOSIS — I2510 Atherosclerotic heart disease of native coronary artery without angina pectoris: Secondary | ICD-10-CM | POA: Diagnosis not present

## 2017-11-03 DIAGNOSIS — I10 Essential (primary) hypertension: Secondary | ICD-10-CM | POA: Diagnosis not present

## 2017-11-03 DIAGNOSIS — I482 Chronic atrial fibrillation, unspecified: Secondary | ICD-10-CM | POA: Diagnosis not present

## 2017-11-03 DIAGNOSIS — I350 Nonrheumatic aortic (valve) stenosis: Secondary | ICD-10-CM | POA: Diagnosis not present

## 2017-11-05 DIAGNOSIS — H401133 Primary open-angle glaucoma, bilateral, severe stage: Secondary | ICD-10-CM | POA: Diagnosis not present

## 2017-11-27 DIAGNOSIS — H2512 Age-related nuclear cataract, left eye: Secondary | ICD-10-CM | POA: Diagnosis not present

## 2017-12-01 DIAGNOSIS — Z85828 Personal history of other malignant neoplasm of skin: Secondary | ICD-10-CM | POA: Diagnosis not present

## 2017-12-01 DIAGNOSIS — L820 Inflamed seborrheic keratosis: Secondary | ICD-10-CM | POA: Diagnosis not present

## 2017-12-01 DIAGNOSIS — Z08 Encounter for follow-up examination after completed treatment for malignant neoplasm: Secondary | ICD-10-CM | POA: Diagnosis not present

## 2017-12-01 DIAGNOSIS — R208 Other disturbances of skin sensation: Secondary | ICD-10-CM | POA: Diagnosis not present

## 2017-12-01 DIAGNOSIS — L570 Actinic keratosis: Secondary | ICD-10-CM | POA: Diagnosis not present

## 2017-12-01 DIAGNOSIS — L821 Other seborrheic keratosis: Secondary | ICD-10-CM | POA: Diagnosis not present

## 2017-12-01 DIAGNOSIS — X32XXXA Exposure to sunlight, initial encounter: Secondary | ICD-10-CM | POA: Diagnosis not present

## 2017-12-08 ENCOUNTER — Other Ambulatory Visit: Payer: Self-pay

## 2017-12-08 ENCOUNTER — Encounter: Payer: Self-pay | Admitting: *Deleted

## 2017-12-10 NOTE — Anesthesia Preprocedure Evaluation (Addendum)
Anesthesia Evaluation  Patient identified by MRN, date of birth, ID band Patient awake    Reviewed: Allergy & Precautions, H&P , NPO status , Patient's Chart, lab work & pertinent test results  Airway Mallampati: II  TM Distance: >3 FB Neck ROM: full    Dental no notable dental hx.    Pulmonary former smoker,    Pulmonary exam normal breath sounds clear to auscultation       Cardiovascular hypertension, + CAD  + dysrhythmias Atrial Fibrillation  Rhythm:regular Rate:Normal  Cardiology eval/clearance at [facility] 11/03/17   Neuro/Psych    GI/Hepatic   Endo/Other  diabetes, Type 2  Renal/GU      Musculoskeletal   Abdominal   Peds  Hematology   Anesthesia Other Findings   Reproductive/Obstetrics                            Anesthesia Physical Anesthesia Plan  ASA: III  Anesthesia Plan: MAC   Post-op Pain Management:    Induction:   PONV Risk Score and Plan: 1 and Midazolam and Treatment may vary due to age or medical condition  Airway Management Planned:   Additional Equipment:   Intra-op Plan:   Post-operative Plan:   Informed Consent: I have reviewed the patients History and Physical, chart, labs and discussed the procedure including the risks, benefits and alternatives for the proposed anesthesia with the patient or authorized representative who has indicated his/her understanding and acceptance.     Plan Discussed with: CRNA  Anesthesia Plan Comments:         Anesthesia Quick Evaluation

## 2017-12-10 NOTE — Discharge Instructions (Signed)

## 2017-12-15 ENCOUNTER — Encounter
Admission: RE | Disposition: A | Discharge status: Home / Self Care | Payer: Self-pay | Source: Ambulatory Visit | Attending: Ophthalmology

## 2017-12-15 ENCOUNTER — Ambulatory Visit: Payer: PPO | Admitting: Anesthesiology

## 2017-12-15 ENCOUNTER — Ambulatory Visit
Admission: RE | Admit: 2017-12-15 | Discharge: 2017-12-15 | Disposition: A | Discharge status: Home / Self Care | Payer: PPO | Source: Ambulatory Visit | Attending: Ophthalmology | Admitting: Ophthalmology

## 2017-12-15 DIAGNOSIS — Z9849 Cataract extraction status, unspecified eye: Secondary | ICD-10-CM | POA: Diagnosis not present

## 2017-12-15 DIAGNOSIS — E1136 Type 2 diabetes mellitus with diabetic cataract: Secondary | ICD-10-CM | POA: Insufficient documentation

## 2017-12-15 DIAGNOSIS — I4891 Unspecified atrial fibrillation: Secondary | ICD-10-CM | POA: Insufficient documentation

## 2017-12-15 DIAGNOSIS — H401120 Primary open-angle glaucoma, left eye, stage unspecified: Secondary | ICD-10-CM | POA: Insufficient documentation

## 2017-12-15 DIAGNOSIS — Z7951 Long term (current) use of inhaled steroids: Secondary | ICD-10-CM | POA: Diagnosis not present

## 2017-12-15 DIAGNOSIS — E1139 Type 2 diabetes mellitus with other diabetic ophthalmic complication: Secondary | ICD-10-CM | POA: Diagnosis not present

## 2017-12-15 DIAGNOSIS — H25812 Combined forms of age-related cataract, left eye: Secondary | ICD-10-CM | POA: Diagnosis not present

## 2017-12-15 DIAGNOSIS — H2512 Age-related nuclear cataract, left eye: Secondary | ICD-10-CM | POA: Diagnosis not present

## 2017-12-15 DIAGNOSIS — Z79899 Other long term (current) drug therapy: Secondary | ICD-10-CM | POA: Insufficient documentation

## 2017-12-15 DIAGNOSIS — Z85828 Personal history of other malignant neoplasm of skin: Secondary | ICD-10-CM | POA: Diagnosis not present

## 2017-12-15 DIAGNOSIS — I2510 Atherosclerotic heart disease of native coronary artery without angina pectoris: Secondary | ICD-10-CM | POA: Diagnosis not present

## 2017-12-15 DIAGNOSIS — I10 Essential (primary) hypertension: Secondary | ICD-10-CM | POA: Diagnosis not present

## 2017-12-15 DIAGNOSIS — Z87891 Personal history of nicotine dependence: Secondary | ICD-10-CM | POA: Diagnosis not present

## 2017-12-15 DIAGNOSIS — H42 Glaucoma in diseases classified elsewhere: Secondary | ICD-10-CM | POA: Diagnosis not present

## 2017-12-15 DIAGNOSIS — Z888 Allergy status to other drugs, medicaments and biological substances status: Secondary | ICD-10-CM | POA: Diagnosis not present

## 2017-12-15 DIAGNOSIS — Z955 Presence of coronary angioplasty implant and graft: Secondary | ICD-10-CM | POA: Insufficient documentation

## 2017-12-15 DIAGNOSIS — Z7984 Long term (current) use of oral hypoglycemic drugs: Secondary | ICD-10-CM | POA: Diagnosis not present

## 2017-12-15 DIAGNOSIS — H401123 Primary open-angle glaucoma, left eye, severe stage: Secondary | ICD-10-CM | POA: Diagnosis not present

## 2017-12-15 DIAGNOSIS — Z7982 Long term (current) use of aspirin: Secondary | ICD-10-CM | POA: Insufficient documentation

## 2017-12-15 HISTORY — DX: Dizziness and giddiness: R42

## 2017-12-15 HISTORY — DX: Presence of external hearing-aid: Z97.4

## 2017-12-15 HISTORY — PX: CATARACT EXTRACTION W/PHACO: SHX586

## 2017-12-15 HISTORY — DX: Presence of dental prosthetic device (complete) (partial): Z97.2

## 2017-12-15 HISTORY — DX: Anemia, unspecified: D64.9

## 2017-12-15 HISTORY — PX: AQUEOUS SHUNT: SHX6305

## 2017-12-15 LAB — GLUCOSE, CAPILLARY
GLUCOSE-CAPILLARY: 88 mg/dL (ref 70–99)
GLUCOSE-CAPILLARY: 113 mg/dL — AB (ref 70–99)

## 2017-12-15 SURGERY — PHACOEMULSIFICATION, CATARACT, WITH IOL INSERTION
Anesthesia: Monitor Anesthesia Care | Laterality: Left

## 2017-12-15 MED ORDER — ARMC OPHTHALMIC DILATING DROPS
1.0000 "application " | OPHTHALMIC | Status: DC | PRN
  Administered 2017-12-15 (×3): 1 via OPHTHALMIC

## 2017-12-15 MED ORDER — MIDAZOLAM HCL 2 MG/2ML IJ SOLN
INTRAMUSCULAR | Status: DC | PRN
  Administered 2017-12-15: 1 mg via INTRAVENOUS

## 2017-12-15 MED ORDER — LIDOCAINE HCL (PF) 4 % IJ SOLN
INTRAMUSCULAR | Status: DC | PRN
  Administered 2017-12-15: 3 mL via OPHTHALMIC

## 2017-12-15 MED ORDER — ERYTHROMYCIN 5 MG/GM OP OINT
TOPICAL_OINTMENT | OPHTHALMIC | Status: DC | PRN
  Administered 2017-12-15: 1 via OPHTHALMIC

## 2017-12-15 MED ORDER — ALFENTANIL 500 MCG/ML IJ INJ
INJECTION | INTRAVENOUS | Status: DC | PRN
  Administered 2017-12-15 (×2): 250 ug via INTRAVENOUS

## 2017-12-15 MED ORDER — ACETAMINOPHEN 325 MG PO TABS: 325.0000 mg | ORAL_TABLET | Freq: Once | ORAL | Status: DC

## 2017-12-15 MED ORDER — CEFUROXIME OPHTHALMIC INJECTION 1 MG/0.1 ML
INJECTION | OPHTHALMIC | Status: DC | PRN
  Administered 2017-12-15: 1 mg via INTRACAMERAL

## 2017-12-15 MED ORDER — HYALURONIDASE HUMAN 150 UNIT/ML IJ SOLN
INTRAMUSCULAR | Status: DC | PRN
  Administered 2017-12-15: 150 [IU] via SUBCUTANEOUS

## 2017-12-15 MED ORDER — EPINEPHRINE PF 1 MG/ML IJ SOLN
INTRAOCULAR | Status: DC | PRN
  Administered 2017-12-15: 70 mL via OPHTHALMIC

## 2017-12-15 MED ORDER — MOXIFLOXACIN HCL 0.5 % OP SOLN
1.0000 [drp] | OPHTHALMIC | Status: DC | PRN
  Administered 2017-12-15 (×3): 1 [drp] via OPHTHALMIC

## 2017-12-15 MED ORDER — ACETAMINOPHEN 160 MG/5ML PO SOLN: 325.0000 mg | Freq: Once | ORAL | Status: DC

## 2017-12-15 MED ORDER — TETRACAINE HCL 0.5 % OP SOLN
1.0000 [drp] | OPHTHALMIC | Status: DC | PRN
  Administered 2017-12-15 (×2): 1 [drp] via OPHTHALMIC

## 2017-12-15 MED ORDER — NA HYALUR & NA CHOND-NA HYALUR 0.4-0.35 ML IO KIT
PACK | INTRAOCULAR | Status: DC | PRN
  Administered 2017-12-15: 1 mL via INTRAOCULAR

## 2017-12-15 MED ORDER — LACTATED RINGERS IV SOLN: INTRAVENOUS | Status: DC

## 2017-12-15 SURGICAL SUPPLY — 38 items
ALLOGRAFT TUTOPLST SCER0.5X1.0 (Tissue) IMPLANT
BANDAGE EYE OVAL (MISCELLANEOUS) ×3 IMPLANT
CANNULA ANT/CHMB 27G (MISCELLANEOUS) ×1 IMPLANT
CANNULA ANT/CHMB 27GA (MISCELLANEOUS) ×6 IMPLANT
CORD BIP STRL DISP 12FT (MISCELLANEOUS) ×3 IMPLANT
CUP MEDICINE 2OZ PLAST GRAD ST (MISCELLANEOUS) ×3 IMPLANT
GLOVE BIO SURGEON STRL SZ7.5 (GLOVE) ×3 IMPLANT
GLOVE SURG LX 7.5 STRW (GLOVE) ×2
GLOVE SURG LX STRL 7.5 STRW (GLOVE) ×1 IMPLANT
GLOVE SURG TRIUMPH 8.0 PF LTX (GLOVE) ×3 IMPLANT
GOWN STRL REUS W/ TWL LRG LVL3 (GOWN DISPOSABLE) ×2 IMPLANT
GOWN STRL REUS W/TWL LRG LVL3 (GOWN DISPOSABLE) ×4
LENS IOL TECNIS ITEC 16.5 (Intraocular Lens) ×2 IMPLANT
MARKER SKIN DUAL TIP RULER LAB (MISCELLANEOUS) ×3 IMPLANT
NDL FILTER BLUNT 18X1 1/2 (NEEDLE) ×2 IMPLANT
NDL RETROBULBAR .5 NSTRL (NEEDLE) ×2 IMPLANT
NDL SPNL 22GX1.5 QUINCKE BK (NEEDLE) ×1 IMPLANT
NEEDLE FILTER BLUNT 18X 1/2SAF (NEEDLE) ×4
NEEDLE FILTER BLUNT 18X1 1/2 (NEEDLE) ×2 IMPLANT
NEEDLE SPNL 22GX1.5 QUINCKE BK (NEEDLE) ×3 IMPLANT
PACK CATARACT BRASINGTON (MISCELLANEOUS) ×3 IMPLANT
PACK EYE AFTER SURG (MISCELLANEOUS) ×3 IMPLANT
PACK OPTHALMIC (MISCELLANEOUS) ×3 IMPLANT
SOL BAL SALT 15ML (MISCELLANEOUS) ×6
SOLUTION BAL SALT 15ML (MISCELLANEOUS) ×2 IMPLANT
SPONGE SURG I SPEAR (MISCELLANEOUS) ×9 IMPLANT
SUT ETHILON 10-0 CS-B-6CS-B-6 (SUTURE) ×3
SUT ETHILON 8 0 TG100 8 (SUTURE) ×3 IMPLANT
SUT VICRYL  9 0 (SUTURE) ×2
SUT VICRYL 9 0 (SUTURE) IMPLANT
SUTURE EHLN 10-0 CS-B-6CS-B-6 (SUTURE) ×1 IMPLANT
SYR 10ML LL (SYRINGE) ×3 IMPLANT
SYR 3ML LL SCALE MARK (SYRINGE) ×3 IMPLANT
SYR TB 1ML LUER SLIP (SYRINGE) ×3 IMPLANT
TUTOPLAST SCIERA 0.5X1.0 (Tissue) ×3 IMPLANT
VALVE GLAUCOMA AHMED (Prosthesis & Implant Heart) ×2 IMPLANT
WATER STERILE IRR 500ML POUR (IV SOLUTION) ×3 IMPLANT
WIPE NON LINTING 3.25X3.25 (MISCELLANEOUS) ×3 IMPLANT

## 2017-12-15 NOTE — Anesthesia Postprocedure Evaluation (Signed)
Anesthesia Post Note  Patient: Paul Santos  Procedure(s) Performed: CATARACT EXTRACTION PHACO AND INTRAOCULAR LENS PLACEMENT (IOC)  LEFT DIABETIC COMPLICATED (Left ) AQUEOUS SHUNT TO EXTRAOCULAR EQUATORIAL PLATE RESERVOIR EXTERNAL APPROACH WITH GRAFT AHMED TUBE SHUNT WITH SCLERAL PATCH GRAFT (Left )  Patient location during evaluation: PACU Anesthesia Type: MAC Level of consciousness: awake and alert and oriented Pain management: satisfactory to patient Vital Signs Assessment: post-procedure vital signs reviewed and stable Respiratory status: spontaneous breathing, nonlabored ventilation and respiratory function stable Cardiovascular status: blood pressure returned to baseline and stable Postop Assessment: Adequate PO intake and No signs of nausea or vomiting Anesthetic complications: no    Raliegh Ip

## 2017-12-15 NOTE — Transfer of Care (Signed)
Immediate Anesthesia Transfer of Care Note  Patient: Paul Santos  Procedure(s) Performed: CATARACT EXTRACTION PHACO AND INTRAOCULAR LENS PLACEMENT (IOC)  LEFT DIABETIC COMPLICATED (Left ) AQUEOUS SHUNT TO EXTRAOCULAR EQUATORIAL PLATE RESERVOIR EXTERNAL APPROACH WITH GRAFT AHMED TUBE SHUNT WITH SCLERAL PATCH GRAFT (Left )  Patient Location: PACU  Anesthesia Type: MAC  Level of Consciousness: awake, alert  and patient cooperative  Airway and Oxygen Therapy: Patient Spontanous Breathing and Patient connected to supplemental oxygen  Post-op Assessment: Post-op Vital signs reviewed, Patient's Cardiovascular Status Stable, Respiratory Function Stable, Patent Airway and No signs of Nausea or vomiting  Post-op Vital Signs: Reviewed and stable  Complications: No apparent anesthesia complications

## 2017-12-15 NOTE — H&P (Signed)
The History and Physical notes are on paper, have been signed, and are to be scanned. The patient remains stable and unchanged from the H&P.   Previous H&P reviewed, patient examined, and there are no changes.  Paul Santos 12/15/2017 8:46 AM]

## 2017-12-15 NOTE — Op Note (Signed)
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  X93.7169 PREOPERATIVE DIAGNOSIS:  Nuclear sclerotic cataract left eye. 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 POSTOPERATIVE DIAGNOSIS:    Nuclear sclerotic cataract left eye  PROCEDURE: Tube shunt with Ahmed glaucoma valve and Tutoplast.  PROCEDURE:  Phacoemusification with posterior chamber intraocular lens placement of the left eye  GLAUCOMA IMPLANT:   Implant Name Type Inv. Item Serial No. Manufacturer Lot No. LRB No. Used  VALVE GLAUCOMA AHMED - [id number] Prosthesis &amp; Implant Heart VALVE GLAUCOMA AHMED [id number] [employer] [id number] Left 1  TUTOPLAST SCIERA 0.5X1.0 - [id number] Tissue TUTOPLAST SCIERA 0.5X1.0 [id number] RIT [id number] Left 1  LENS IOL DIOP 16.5 - [id number] Intraocular Lens LENS IOL DIOP 16.5 [id number]  Left 1    ULTRASOUND TIME: 15  % of 1 minutes 28 seconds, CDE 13.6  SURGEON: Wyonia Hough, MD   ANESTHESIA: Retrobulbar block, Xylocaine and bupivacaine.   PROCEDURE: The patient was identified in the holding room and transported to the operating suite and placed in the supine position underneath the operating microscope. The left eye was identified as the operative eye and a retrobulbar block of Xylocaine and bupivacaine was administered under intravenous sedation. It was then prepped and draped in the usual sterile ophthalmic fashion.      A 1 millimeter clear-corneal paracentesis was made at the 1:30 position.  0.5 ml of preservative-free 1% lidocaine was injected into the anterior chamber.  The anterior chamber was filled with Viscoat viscoelastic.  A 2.4 millimeter keratome was used to make a near-clear corneal incision at the 10:30 position.  .  A curvilinear capsulorrhexis was made with a cystotome and capsulorrhexis forceps.  Balanced salt solution was used to  hydrodissect and hydrodelineate the nucleus.   Phacoemulsification was then used in stop and chop fashion to remove the lens nucleus and epinucleus.  The remaining cortex was then removed using the irrigation and aspiration handpiece. Provisc was then placed into the capsular bag to distend it for lens placement.  A lens was then injected into the capsular bag.  The remaining viscoelastic was aspirated.   Wounds were hydrated with balanced salt solution.  a 10-0 nylon suture was placed through the main incision.    A conjunctival peritomy was made 3 mm posterior to the limbus from the 12:00 o'clock to  2:30 o'clock positions. Hemostasis was achieved with wet field cautery. Westcott scissors were used with blunt and sharp dissection to dissect a scleral pocket underneath conjunctiva and Tenons in the superotemporal quadrant.    A caliper was used to measure a position 10 mm posterior to the limbus in the superotemporal quadrant. Two 8-0 nylon sutures were placed at this distance in order to anchor the Ahmed glaucoma valve, model FP7. The Ahmed glaucoma valve was inspected and its tube was cannulated with a 27-gauge cannula with balanced salt solution. Balanced salt solution was then injected into the glaucoma valve to prime it. It was found to be in good working condition. The Ahmed valve was then placed into the superotemporal pocket underneath the conjunctiva and Tenons. It was sutured with the preplaced 8-0 nylon sutures at a position 10 mm posterior to the limbus. This was verified after its placement with a caliper. The tube end was then cut with an anterior bevel to position into the anterior chamber. A paracentesis incision was made through clear cornea at the  10:30 o'clock position. The anterior chamber was filled with Provisc. A 22-gauge needle was then used to enter the anterior chamber in the superotemporal quadrant near the limbus. The tube was then placed into the anterior chamber parallel with  the iris. There was no corneal or iris touch. An 8-0 nylon suture was used to secure the length of the tube to the sclera. A Tutoplast graft was then cut to fit, 0.6 x 1.0 cm from the limbus over the tube to the area of the plate. This was secured with 4 interrupted 8-0 nylon sutures. The anterior chamber was filled with balanced salt solution and the incision was noted to be watertight. Provisc was evacuated from the eye during this. The eye was noted to lower to a low physiologic pressure after being instilled with balanced salt solution. A one-third fill of viscoat ws placed into the eye. The conjunctiva and Tenons were closed at the limbus using running 9-0 Vicryl suture. Additional balanced salt solution was placed into the anterior chamber. The pressure quickly reduced to a low physiologic pressure of about 10. There were no wound leaks noted. The conjunctival incision was tight and there was bleb formation. Topical erythromycin ointment was applied to the eye. The eye was patched and shielded. The patient was taken to the recovery room in stable condition without complications of anesthesia or surgery.     Shahir Karen 12/15/2017, 10:41 AM

## 2017-12-15 NOTE — Anesthesia Procedure Notes (Signed)
Procedure Name: MAC Date/Time: 12/15/2017 9:29 AM Performed by: Jeannene Patella, CRNA Pre-anesthesia Checklist: Patient identified, Emergency Drugs available, Suction available, Patient being monitored and Timeout performed Patient Re-evaluated:Patient Re-evaluated prior to induction Oxygen Delivery Method: Nasal cannula

## 2017-12-22 DIAGNOSIS — E119 Type 2 diabetes mellitus without complications: Secondary | ICD-10-CM | POA: Diagnosis not present

## 2017-12-22 DIAGNOSIS — I10 Essential (primary) hypertension: Secondary | ICD-10-CM | POA: Diagnosis not present

## 2017-12-29 DIAGNOSIS — E119 Type 2 diabetes mellitus without complications: Secondary | ICD-10-CM | POA: Diagnosis not present

## 2017-12-29 DIAGNOSIS — Z0000 Encounter for general adult medical examination without abnormal findings: Secondary | ICD-10-CM | POA: Diagnosis not present

## 2018-02-18 DIAGNOSIS — H02053 Trichiasis without entropian right eye, unspecified eyelid: Secondary | ICD-10-CM | POA: Diagnosis not present

## 2018-03-22 DIAGNOSIS — Z961 Presence of intraocular lens: Secondary | ICD-10-CM | POA: Diagnosis not present

## 2018-03-22 DIAGNOSIS — H401133 Primary open-angle glaucoma, bilateral, severe stage: Secondary | ICD-10-CM | POA: Diagnosis not present

## 2018-06-23 DIAGNOSIS — E119 Type 2 diabetes mellitus without complications: Secondary | ICD-10-CM | POA: Diagnosis not present

## 2018-06-24 DIAGNOSIS — H401133 Primary open-angle glaucoma, bilateral, severe stage: Secondary | ICD-10-CM | POA: Diagnosis not present

## 2018-06-30 DIAGNOSIS — E1142 Type 2 diabetes mellitus with diabetic polyneuropathy: Secondary | ICD-10-CM | POA: Diagnosis not present

## 2018-06-30 DIAGNOSIS — L570 Actinic keratosis: Secondary | ICD-10-CM | POA: Diagnosis not present

## 2018-06-30 DIAGNOSIS — Z125 Encounter for screening for malignant neoplasm of prostate: Secondary | ICD-10-CM | POA: Diagnosis not present

## 2018-06-30 DIAGNOSIS — E785 Hyperlipidemia, unspecified: Secondary | ICD-10-CM | POA: Diagnosis not present

## 2018-06-30 DIAGNOSIS — I10 Essential (primary) hypertension: Secondary | ICD-10-CM | POA: Diagnosis not present

## 2018-09-01 DIAGNOSIS — D2261 Melanocytic nevi of right upper limb, including shoulder: Secondary | ICD-10-CM | POA: Diagnosis not present

## 2018-09-01 DIAGNOSIS — X32XXXA Exposure to sunlight, initial encounter: Secondary | ICD-10-CM | POA: Diagnosis not present

## 2018-09-01 DIAGNOSIS — D485 Neoplasm of uncertain behavior of skin: Secondary | ICD-10-CM | POA: Diagnosis not present

## 2018-09-01 DIAGNOSIS — D2272 Melanocytic nevi of left lower limb, including hip: Secondary | ICD-10-CM | POA: Diagnosis not present

## 2018-09-01 DIAGNOSIS — Z08 Encounter for follow-up examination after completed treatment for malignant neoplasm: Secondary | ICD-10-CM | POA: Diagnosis not present

## 2018-09-01 DIAGNOSIS — L570 Actinic keratosis: Secondary | ICD-10-CM | POA: Diagnosis not present

## 2018-09-01 DIAGNOSIS — D225 Melanocytic nevi of trunk: Secondary | ICD-10-CM | POA: Diagnosis not present

## 2018-09-01 DIAGNOSIS — C44519 Basal cell carcinoma of skin of other part of trunk: Secondary | ICD-10-CM | POA: Diagnosis not present

## 2018-09-01 DIAGNOSIS — Z85828 Personal history of other malignant neoplasm of skin: Secondary | ICD-10-CM | POA: Diagnosis not present

## 2018-09-01 DIAGNOSIS — D2262 Melanocytic nevi of left upper limb, including shoulder: Secondary | ICD-10-CM | POA: Diagnosis not present

## 2018-10-25 DIAGNOSIS — H401123 Primary open-angle glaucoma, left eye, severe stage: Secondary | ICD-10-CM | POA: Diagnosis not present

## 2018-11-01 DIAGNOSIS — H401133 Primary open-angle glaucoma, bilateral, severe stage: Secondary | ICD-10-CM | POA: Diagnosis not present

## 2018-11-19 DIAGNOSIS — D485 Neoplasm of uncertain behavior of skin: Secondary | ICD-10-CM | POA: Diagnosis not present

## 2018-11-19 DIAGNOSIS — L570 Actinic keratosis: Secondary | ICD-10-CM | POA: Diagnosis not present

## 2018-11-19 DIAGNOSIS — C44519 Basal cell carcinoma of skin of other part of trunk: Secondary | ICD-10-CM | POA: Diagnosis not present

## 2018-11-19 DIAGNOSIS — C44319 Basal cell carcinoma of skin of other parts of face: Secondary | ICD-10-CM | POA: Diagnosis not present

## 2018-11-19 DIAGNOSIS — X32XXXA Exposure to sunlight, initial encounter: Secondary | ICD-10-CM | POA: Diagnosis not present

## 2019-01-26 DIAGNOSIS — C44319 Basal cell carcinoma of skin of other parts of face: Secondary | ICD-10-CM | POA: Diagnosis not present

## 2019-01-26 DIAGNOSIS — L570 Actinic keratosis: Secondary | ICD-10-CM | POA: Diagnosis not present

## 2019-01-26 DIAGNOSIS — D485 Neoplasm of uncertain behavior of skin: Secondary | ICD-10-CM | POA: Diagnosis not present

## 2019-01-26 DIAGNOSIS — C4442 Squamous cell carcinoma of skin of scalp and neck: Secondary | ICD-10-CM | POA: Diagnosis not present

## 2019-01-26 DIAGNOSIS — X32XXXA Exposure to sunlight, initial encounter: Secondary | ICD-10-CM | POA: Diagnosis not present

## 2019-01-26 DIAGNOSIS — C4441 Basal cell carcinoma of skin of scalp and neck: Secondary | ICD-10-CM | POA: Diagnosis not present

## 2019-02-02 DIAGNOSIS — C4442 Squamous cell carcinoma of skin of scalp and neck: Secondary | ICD-10-CM | POA: Diagnosis not present

## 2019-02-09 DIAGNOSIS — E1142 Type 2 diabetes mellitus with diabetic polyneuropathy: Secondary | ICD-10-CM | POA: Diagnosis not present

## 2019-02-09 DIAGNOSIS — I10 Essential (primary) hypertension: Secondary | ICD-10-CM | POA: Diagnosis not present

## 2019-02-09 DIAGNOSIS — Z125 Encounter for screening for malignant neoplasm of prostate: Secondary | ICD-10-CM | POA: Diagnosis not present

## 2019-02-09 DIAGNOSIS — E785 Hyperlipidemia, unspecified: Secondary | ICD-10-CM | POA: Diagnosis not present

## 2019-02-16 DIAGNOSIS — I10 Essential (primary) hypertension: Secondary | ICD-10-CM | POA: Diagnosis not present

## 2019-02-16 DIAGNOSIS — I2510 Atherosclerotic heart disease of native coronary artery without angina pectoris: Secondary | ICD-10-CM | POA: Diagnosis not present

## 2019-02-16 DIAGNOSIS — E119 Type 2 diabetes mellitus without complications: Secondary | ICD-10-CM | POA: Diagnosis not present

## 2019-02-16 DIAGNOSIS — E782 Mixed hyperlipidemia: Secondary | ICD-10-CM | POA: Diagnosis not present

## 2019-02-16 DIAGNOSIS — I482 Chronic atrial fibrillation, unspecified: Secondary | ICD-10-CM | POA: Diagnosis not present

## 2019-02-16 DIAGNOSIS — F4321 Adjustment disorder with depressed mood: Secondary | ICD-10-CM | POA: Diagnosis not present

## 2019-02-16 DIAGNOSIS — Z0000 Encounter for general adult medical examination without abnormal findings: Secondary | ICD-10-CM | POA: Diagnosis not present

## 2019-03-08 DIAGNOSIS — H401133 Primary open-angle glaucoma, bilateral, severe stage: Secondary | ICD-10-CM | POA: Diagnosis not present

## 2019-05-27 DIAGNOSIS — L821 Other seborrheic keratosis: Secondary | ICD-10-CM | POA: Diagnosis not present

## 2019-05-27 DIAGNOSIS — Z85828 Personal history of other malignant neoplasm of skin: Secondary | ICD-10-CM | POA: Diagnosis not present

## 2019-05-27 DIAGNOSIS — D2271 Melanocytic nevi of right lower limb, including hip: Secondary | ICD-10-CM | POA: Diagnosis not present

## 2019-05-27 DIAGNOSIS — L820 Inflamed seborrheic keratosis: Secondary | ICD-10-CM | POA: Diagnosis not present

## 2019-05-27 DIAGNOSIS — D2261 Melanocytic nevi of right upper limb, including shoulder: Secondary | ICD-10-CM | POA: Diagnosis not present

## 2019-05-27 DIAGNOSIS — L570 Actinic keratosis: Secondary | ICD-10-CM | POA: Diagnosis not present

## 2019-05-27 DIAGNOSIS — L538 Other specified erythematous conditions: Secondary | ICD-10-CM | POA: Diagnosis not present

## 2019-05-27 DIAGNOSIS — D2262 Melanocytic nevi of left upper limb, including shoulder: Secondary | ICD-10-CM | POA: Diagnosis not present

## 2019-05-27 DIAGNOSIS — D225 Melanocytic nevi of trunk: Secondary | ICD-10-CM | POA: Diagnosis not present

## 2019-05-27 DIAGNOSIS — X32XXXA Exposure to sunlight, initial encounter: Secondary | ICD-10-CM | POA: Diagnosis not present

## 2019-07-08 DIAGNOSIS — H401133 Primary open-angle glaucoma, bilateral, severe stage: Secondary | ICD-10-CM | POA: Diagnosis not present

## 2019-08-10 DIAGNOSIS — E119 Type 2 diabetes mellitus without complications: Secondary | ICD-10-CM | POA: Diagnosis not present

## 2019-08-17 DIAGNOSIS — E119 Type 2 diabetes mellitus without complications: Secondary | ICD-10-CM | POA: Diagnosis not present

## 2019-08-17 DIAGNOSIS — F4321 Adjustment disorder with depressed mood: Secondary | ICD-10-CM | POA: Diagnosis not present

## 2019-08-17 DIAGNOSIS — Z125 Encounter for screening for malignant neoplasm of prostate: Secondary | ICD-10-CM | POA: Diagnosis not present

## 2019-08-17 DIAGNOSIS — I10 Essential (primary) hypertension: Secondary | ICD-10-CM | POA: Diagnosis not present

## 2019-08-17 DIAGNOSIS — E785 Hyperlipidemia, unspecified: Secondary | ICD-10-CM | POA: Diagnosis not present

## 2019-08-17 DIAGNOSIS — I2510 Atherosclerotic heart disease of native coronary artery without angina pectoris: Secondary | ICD-10-CM | POA: Diagnosis not present

## 2019-08-17 DIAGNOSIS — I482 Chronic atrial fibrillation, unspecified: Secondary | ICD-10-CM | POA: Diagnosis not present

## 2019-11-08 DIAGNOSIS — H401133 Primary open-angle glaucoma, bilateral, severe stage: Secondary | ICD-10-CM | POA: Diagnosis not present

## 2020-01-30 DIAGNOSIS — L538 Other specified erythematous conditions: Secondary | ICD-10-CM | POA: Diagnosis not present

## 2020-01-30 DIAGNOSIS — L820 Inflamed seborrheic keratosis: Secondary | ICD-10-CM | POA: Diagnosis not present

## 2020-01-30 DIAGNOSIS — X32XXXA Exposure to sunlight, initial encounter: Secondary | ICD-10-CM | POA: Diagnosis not present

## 2020-01-30 DIAGNOSIS — L570 Actinic keratosis: Secondary | ICD-10-CM | POA: Diagnosis not present

## 2020-01-30 DIAGNOSIS — D2262 Melanocytic nevi of left upper limb, including shoulder: Secondary | ICD-10-CM | POA: Diagnosis not present

## 2020-01-30 DIAGNOSIS — D225 Melanocytic nevi of trunk: Secondary | ICD-10-CM | POA: Diagnosis not present

## 2020-01-30 DIAGNOSIS — Z85828 Personal history of other malignant neoplasm of skin: Secondary | ICD-10-CM | POA: Diagnosis not present

## 2020-01-30 DIAGNOSIS — D2272 Melanocytic nevi of left lower limb, including hip: Secondary | ICD-10-CM | POA: Diagnosis not present

## 2020-01-30 DIAGNOSIS — D2261 Melanocytic nevi of right upper limb, including shoulder: Secondary | ICD-10-CM | POA: Diagnosis not present

## 2020-02-16 DIAGNOSIS — I10 Essential (primary) hypertension: Secondary | ICD-10-CM | POA: Diagnosis not present

## 2020-02-16 DIAGNOSIS — E785 Hyperlipidemia, unspecified: Secondary | ICD-10-CM | POA: Diagnosis not present

## 2020-02-16 DIAGNOSIS — Z125 Encounter for screening for malignant neoplasm of prostate: Secondary | ICD-10-CM | POA: Diagnosis not present

## 2020-02-16 DIAGNOSIS — E119 Type 2 diabetes mellitus without complications: Secondary | ICD-10-CM | POA: Diagnosis not present

## 2020-02-23 DIAGNOSIS — I10 Essential (primary) hypertension: Secondary | ICD-10-CM | POA: Diagnosis not present

## 2020-02-23 DIAGNOSIS — H543 Unqualified visual loss, both eyes: Secondary | ICD-10-CM | POA: Diagnosis not present

## 2020-02-23 DIAGNOSIS — E782 Mixed hyperlipidemia: Secondary | ICD-10-CM | POA: Diagnosis not present

## 2020-02-23 DIAGNOSIS — D649 Anemia, unspecified: Secondary | ICD-10-CM | POA: Diagnosis not present

## 2020-02-23 DIAGNOSIS — E119 Type 2 diabetes mellitus without complications: Secondary | ICD-10-CM | POA: Diagnosis not present

## 2020-02-23 DIAGNOSIS — Z0000 Encounter for general adult medical examination without abnormal findings: Secondary | ICD-10-CM | POA: Diagnosis not present

## 2020-02-23 DIAGNOSIS — H9193 Unspecified hearing loss, bilateral: Secondary | ICD-10-CM | POA: Diagnosis not present

## 2020-03-20 DEATH — deceased
